# Patient Record
Sex: Male | Born: 1975 | Race: Black or African American | Hispanic: No | Marital: Single | Smoking: Never smoker
Health system: Southern US, Community
[De-identification: ages and names within clinical notes are randomized; demographics above are authoritative.]

## PROBLEM LIST (undated history)

## (undated) HISTORY — PX: LEG SURGERY: SHX1003

---

## 2014-09-18 ENCOUNTER — Ambulatory Visit: Payer: Self-pay

## 2014-09-18 ENCOUNTER — Encounter: Payer: Self-pay | Admitting: Podiatry

## 2014-09-18 NOTE — Progress Notes (Signed)
This encounter was created in error - please disregard.

## 2014-10-04 ENCOUNTER — Other Ambulatory Visit: Payer: Self-pay | Admitting: Obstetrics and Gynecology

## 2014-10-11 LAB — SEMEN ANALYSIS, BASIC
IMMATURE GERM CELL CONC.: 4.8 x10E6/mL
NORMAL MORPHOLOGY-STRICT: 25 % (ref >3)

## 2014-10-17 ENCOUNTER — Ambulatory Visit: Payer: Worker's Compensation

## 2014-10-17 ENCOUNTER — Ambulatory Visit
Admission: EM | Admit: 2014-10-17 | Discharge: 2014-10-17 | Disposition: A | Discharge status: Home / Self Care | Payer: Worker's Compensation | Attending: Family Medicine | Admitting: Family Medicine

## 2014-10-17 ENCOUNTER — Encounter: Payer: Self-pay | Admitting: Emergency Medicine

## 2014-10-17 DIAGNOSIS — S61210A Laceration without foreign body of right index finger without damage to nail, initial encounter: Secondary | ICD-10-CM | POA: Diagnosis not present

## 2014-10-17 MED ORDER — CEPHALEXIN 500 MG PO CAPS: 500.0000 mg | ORAL_CAPSULE | Freq: Four times a day (QID) | ORAL | Status: DC

## 2014-10-17 MED ORDER — TETANUS-DIPHTH-ACELL PERTUSSIS 5-2.5-18.5 LF-MCG/0.5 IM SUSP
0.5000 mL | Freq: Once | INTRAMUSCULAR | Status: AC
  Administered 2014-10-17: 0.5 mL via INTRAMUSCULAR

## 2014-10-17 MED ORDER — BACITRACIN ZINC 500 UNIT/GM EX OINT: TOPICAL_OINTMENT | Freq: Once | CUTANEOUS | Status: DC

## 2014-10-17 MED ORDER — CEPHALEXIN 500 MG PO CAPS: 500.0000 mg | ORAL_CAPSULE | Freq: Four times a day (QID) | ORAL | Status: AC

## 2014-10-17 MED ORDER — LIDOCAINE HCL (PF) 1 % IJ SOLN: 5.0000 mL | Freq: Once | INTRAMUSCULAR | Status: DC

## 2014-10-17 NOTE — ED Notes (Signed)
Patient states that he cut his right 2nd finger on a grinding wheel at work yesterday around 7:00pm.

## 2014-10-17 NOTE — ED Provider Notes (Signed)
[facility] Emergency Department Provider Note  ____________________________________________  Time seen: Approximately 9:19 AM  I have reviewed the triage vital signs and the nursing notes.   HISTORY  Chief Complaint Extremity Laceration and Worker's Comp Injury    HPI Oscar Bennett is a 39 y.o. male presents for complaint of laceration to right second finger. Patient reports laceration occurred at approximate 7 PM last night at work. 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 Denies crush injury. Reports pain at laceration site as well. States pain is 4 out of 10 aching pain. Denies pain radiation. Denies numbness or difficulty bending or moving finger. Denies other pain. Reports this is a workers comp injury. Reports has clean the wound multiple times with peroxide at home.    History reviewed. No pertinent past medical history.  There are no active problems to display for this patient.   Past Surgical History  Procedure Laterality Date  . Leg surgery Right     No current outpatient prescriptions on file. Allergies Review of patient's allergies indicates no known allergies.  History reviewed. No pertinent family history.  Social History Social History  Substance Use Topics  . Smoking status: Never Smoker   . Smokeless tobacco: None  . Alcohol Use: No    Review of Systems Constitutional: No fever/chills Eyes: No visual changes. ENT: No sore throat. Cardiovascular: Denies chest pain. Respiratory: Denies shortness of breath. Gastrointestinal: No abdominal pain.  No nausea, no vomiting.  No diarrhea.  No constipation. Genitourinary: Negative for dysuria. Musculoskeletal: Negative for back pain. Skin: Negative for rash. Positive for right second finger laceration.  Neurological: Negative for headaches, focal weakness or  numbness.  10-point ROS otherwise negative.  ____________________________________________   PHYSICAL EXAM:  VITAL SIGNS: ED Triage Vitals  Enc Vitals Group     BP 10/17/14 0856 135/85 mmHg     Pulse Rate 10/17/14 0856 72     Resp 10/17/14 0856 16     Temp 10/17/14 0856 97.2 F (36.2 C)     Temp Source 10/17/14 0856 Tympanic     SpO2 10/17/14 0856 97 %     Weight 10/17/14 0856 240 lb (108.863 kg)     Height 10/17/14 0856 6' (1.829 m)     Head Cir --      Peak Flow --      Pain Score 10/17/14 0858 5     Pain Loc --      Pain Edu? --      Excl. in GC? --     Constitutional: Alert and oriented. Well appearing and in no acute distress. Eyes: Conjunctivae are normal. PERRL. EOMI. Head: Atraumatic.  Mouth/Throat: Mucous membranes are moist.   Neck: No stridor.  No cervical spine tenderness to palpation. Cardiovascular: Normal rate, regular rhythm. Grossly normal heart sounds.  Good peripheral circulation. Respiratory: Normal respiratory effort.  No retractions. Lungs CTAB. Gastrointestinal: Soft and nontender.  Musculoskeletal: No lower or upper extremity tenderness nor edema.  No joint effusions. Bilateral pedal pulses equal and easily palpated.  Skin: Skin intact except: right medial distal second finger 1.5 cm gaping laceration, no erythema, no drainage, Full ROM, no tendon or motor deficit, mild TTP, No sensation deficit. Full ROm. Bilateral hand grips equal. Cap refill <2 secs.  Psychiatric: Mood and affect are normal. Speech and behavior are normal.  ____________________________________________   LABS (all labs ordered are  listed, but only abnormal results are displayed)  Labs Reviewed - No data to display  EXAM: RIGHT INDEX FINGER 2+V  COMPARISON: None.  FINDINGS: There is no evidence of fracture or dislocation. There is no evidence of arthropathy or other focal bone abnormality. There is soft tissue ulceration of the medial tip of the second  phalanx.  IMPRESSION: No acute osseous abnormality identified.   Electronically Signed By: Ted Mcalpine M.D. On: 10/17/2014 09:41 I, Renford Dills, personally viewed and evaluated these images (plain radiographs) as part of my medical decision making.    PROCEDURES  Procedure(s) performed:  Procedure explained and verbal consent obtained. Laceration Repaired  Location: right distal index finger Length: 1.5 cm Cleaned with saline and betadine with copious irrigation. Sterile technique Anesthesia with 1% Lidocaine 3 mls Repaired with # 2 5-0 nylon sutures Technique: simple interrupted Patient tolerate well. Wound well approximated post loose repair.  Antibiotic ointment and dressing applied.  Wound care instructions provided.  Observe for any signs of infection or other problems.    INITIAL IMPRESSION / ASSESSMENT AND PLAN / ED COURSE  Pertinent labs & imaging results that were available during my care of the patient were reviewed by me and considered in my medical decision making (see chart for details).  Presents for laceration repair post injury last night at work. 1.5 cm gaping and distal right index finger laceration present. X-ray negative for acute bony injury and negative for foreign body. Repaired with 2 sutures. Patient tolerated well. Will start patient on oral cephalexin prophylactic as the wound has been greater than 12 hours and due to mechanism. Discussed strict follow-up and return parameters including wound monitoring and cleaning. Return in 7 days for suture removal. Patient provides understanding and agree to plan.tdap updated.  ____________________________________________   FINAL CLINICAL IMPRESSION(S) / ED DIAGNOSES  Final diagnoses:  Laceration of right index finger w/o foreign body w/o damage to nail, initial encounter       Renford Dills, NP 10/17/14 1037  Renford Dills, NP 10/17/14 [phone]

## 2014-10-17 NOTE — Discharge Instructions (Signed)
Keep clean. Clean daily with soap and water, rinse, pat dry then apply thin layer of topical antibiotic ointment such as neosporin.   Return to Urgent care in 7 days for suture removal. Return sooner for redness, swelling, drainage, pain , new or worsening concerns.   Laceration Care, Adult A laceration is a cut or lesion that goes through all layers of the skin and into the tissue just beneath the skin. TREATMENT  Some lacerations may not require closure. Some lacerations may not be able to be closed due to an increased risk of infection. It is important to see your caregiver as soon as possible after an injury to minimize the risk of infection and maximize the opportunity for successful closure. If closure is appropriate, pain medicines may be given, if needed. The wound will be cleaned to help prevent infection. Your caregiver will use stitches (sutures), staples, wound glue (adhesive), or skin adhesive strips to repair the laceration. These tools bring the skin edges together to allow for faster healing and a better cosmetic outcome. However, all wounds will heal with a scar. Once the wound has healed, scarring can be minimized by covering the wound with sunscreen during the day for 1 full year. HOME CARE INSTRUCTIONS  For sutures or staples:  Keep the wound clean and dry.  If you were given a bandage (dressing), you should change it at least once a day. Also, change the dressing if it becomes wet or dirty, or as directed by your caregiver.  Wash the wound with soap and water 2 times a day. Rinse the wound off with water to remove all soap. Pat the wound dry with a clean towel.  After cleaning, apply a thin layer of the antibiotic ointment as recommended by your caregiver. This will help prevent infection and keep the dressing from sticking.  You may shower as usual after the first 24 hours. Do not soak the wound in water until the sutures are removed.  Only take over-the-counter or  prescription medicines for pain, discomfort, or fever as directed by your caregiver.  Get your sutures or staples removed as directed by your caregiver. For skin adhesive strips:  Keep the wound clean and dry.  Do not get the skin adhesive strips wet. You may bathe carefully, using caution to keep the wound dry.  If the wound gets wet, pat it dry with a clean towel.  Skin adhesive strips will fall off on their own. You may trim the strips as the wound heals. Do not remove skin adhesive strips that are still stuck to the wound. They will fall off in time. For wound adhesive:  You may briefly wet your wound in the shower or bath. Do not soak or scrub the wound. Do not swim. Avoid periods of heavy perspiration until the skin adhesive has fallen off on its own. After showering or bathing, gently pat the wound dry with a clean towel.  Do not apply liquid medicine, cream medicine, or ointment medicine to your wound while the skin adhesive is in place. This may loosen the film before your wound is healed.  If a dressing is placed over the wound, be careful not to apply tape directly over the skin adhesive. This may cause the adhesive to be pulled off before the wound is healed.  Avoid prolonged exposure to sunlight or tanning lamps while the skin adhesive is in place. Exposure to ultraviolet light in the first year will darken the scar.  The skin adhesive  will usually remain in place for 5 to 10 days, then naturally fall off the skin. Do not pick at the adhesive film. You may need a tetanus shot if:  You cannot remember when you had your last tetanus shot.  You have never had a tetanus shot. If you get a tetanus shot, your arm may swell, get red, and feel warm to the touch. This is common and not a problem. If you need a tetanus shot and you choose not to have one, there is a rare chance of getting tetanus. Sickness from tetanus can be serious. SEEK MEDICAL CARE IF:   You have redness,  swelling, or increasing pain in the wound.  You see a red line that goes away from the wound.  You have yellowish-white fluid (pus) coming from the wound.  You have a fever.  You notice a bad smell coming from the wound or dressing.  Your wound breaks open before or after sutures have been removed.  You notice something coming out of the wound such as wood or glass.  Your wound is on your hand or foot and you cannot move a finger or toe. SEEK IMMEDIATE MEDICAL CARE IF:   Your pain is not controlled with prescribed medicine.  You have severe swelling around the wound causing pain and numbness or a change in color in your arm, hand, leg, or foot.  Your wound splits open and starts bleeding.  You have worsening numbness, weakness, or loss of function of any joint around or beyond the wound.  You develop painful lumps near the wound or on the skin anywhere on your body. MAKE SURE YOU:   Understand these instructions.  Will watch your condition.  Will get help right away if you are not doing well or get worse. Document Released: 01/25/2005 Document Revised: 04/19/2011 Document Reviewed: 07/21/2010 [facility] Patient Information 2015 [employer] [place]. This information is not intended to replace advice given to you by your health care provider. Make sure you discuss any questions you have with your health care provider.

## 2015-09-11 ENCOUNTER — Ambulatory Visit (INDEPENDENT_AMBULATORY_CARE_PROVIDER_SITE_OTHER): Payer: BLUE CROSS/BLUE SHIELD

## 2015-09-11 ENCOUNTER — Ambulatory Visit (INDEPENDENT_AMBULATORY_CARE_PROVIDER_SITE_OTHER): Payer: BLUE CROSS/BLUE SHIELD | Admitting: Podiatry

## 2015-09-11 ENCOUNTER — Encounter: Payer: Self-pay | Admitting: Podiatry

## 2015-09-11 VITALS — BP 144/91 | HR 78 | Resp 18

## 2015-09-11 DIAGNOSIS — M722 Plantar fascial fibromatosis: Secondary | ICD-10-CM | POA: Diagnosis not present

## 2015-09-11 DIAGNOSIS — M779 Enthesopathy, unspecified: Secondary | ICD-10-CM

## 2015-09-11 DIAGNOSIS — R52 Pain, unspecified: Secondary | ICD-10-CM

## 2015-09-11 DIAGNOSIS — M76829 Posterior tibial tendinitis, unspecified leg: Secondary | ICD-10-CM

## 2015-09-11 DIAGNOSIS — M6789 Other specified disorders of synovium and tendon, multiple sites: Secondary | ICD-10-CM | POA: Diagnosis not present

## 2015-09-11 MED ORDER — MELOXICAM 15 MG PO TABS: 15.0000 mg | ORAL_TABLET | Freq: Every day | ORAL | 2 refills | Status: DC

## 2015-09-11 NOTE — Progress Notes (Addendum)
Subjective:    Patient ID: Oscar Bennett, male    DOB: Jun 15, 1975, 40 y.o.   MRN: [id number]  HPI  40 year old male presents the office today for concerns of left foot which is been ongoing for about 8 months. He previously did go seen other physicians for this and he had 1 injection which should help. He also had an over-the-counter inserts which have helped some degree. He still needs to get pain. 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 He has pain in the morning he first gets up after standing all day. States he is flatfoot. Denies any recent injury or trauma. No swelling or redness. No numbness or tingling. No other complaints at this time.  Review of Systems  All other systems reviewed and are negative.      Objective:   Physical Exam General: AAO x3, NAD  Dermatological: Skin is warm, dry and supple bilateral. Nails x 10 are well manicured; remaining integument appears unremarkable at this time. There are no open sores, no preulcerative lesions, no rash or signs of infection present.  Vascular: Dorsalis Pedis artery and Posterior Tibial artery pedal pulses are 2/4 bilateral with immedate capillary fill time. Pedal hair growth present. No varicosities and no lower extremity edema present bilateral. There is no pain with calf compression, swelling, warmth, erythema.   Neruologic: Grossly intact via light touch bilateral. Vibratory intact via tuning fork bilateral. Protective threshold with Semmes Wienstein monofilament intact to all pedal sites bilateral. Patellar and Achilles deep tendon reflexes 2+ bilateral.   Musculoskeletal: Tenderness to palpation along the plantar medial tubercle of the calcaneus at the insertion of plantar fascia on the left foot. There is no pain along the course of the plantar fascia within the arch of the foot. Plantar fascia appears to be intact. There is no pain with lateral compression of the calcaneus or pain with vibratory sensation.  There is no pain along the course or insertion of the achilles tendon. There is also mild discomfort on the insertion of the posterior tibial tendon into the navicular. He is able to do a single and double heel rise. No other areas of tenderness to bilateral lower extremities. There is a significant decrease in medial arch height upon weightbearing. Equinus is present. MMT 5/5.  Marland Kitchen  Gait: Unassisted, Nonantalgic.      Assessment & Plan:  40 year old male left flatfoot deformity with tendinitis, plantar fasciitis -Treatment options discussed including all alternatives, risks, and complications -Etiology of symptoms were discussed -X-rays were obtained and reviewed with the patient. Evidence of flatfoot deformity. No evidence of acute fracture. -Patient elects to proceed with steroid injection into the left heel. Under sterile skin preparation, a total of 2.5cc of kenalog 10, 0.5% Marcaine plain, and 2% lidocaine plain were infiltrated into the symptomatic area without complication. A band-aid was applied. Patient tolerated the injection well without complication. Post-injection care with discussed with the patient. Discussed with the patient to ice the area over the next couple of days to help prevent a steroid flare.  -Ankle brace dispensed -Discussed shoe gear modifications and orthotics. He will but can proceed with custom orthotics he was scanned for them and they were sent to [facility] labs. -Prescribed mobic. Discussed side effects of the medication and directed to stop if any are to occur and call the office.  -Stretching exercises daily. -Follow-up as scheduled or sooner if any problems arise. In the meantime, encouraged to call the office with any questions, concerns, change  in symptoms.   Celesta Gentile, DPM

## 2015-09-11 NOTE — Patient Instructions (Signed)

## 2015-09-14 DIAGNOSIS — M722 Plantar fascial fibromatosis: Secondary | ICD-10-CM | POA: Insufficient documentation

## 2015-09-14 HISTORY — DX: Plantar fascial fibromatosis: M72.2

## 2015-10-02 ENCOUNTER — Encounter: Payer: Self-pay | Admitting: Podiatry

## 2015-10-02 ENCOUNTER — Ambulatory Visit (INDEPENDENT_AMBULATORY_CARE_PROVIDER_SITE_OTHER): Payer: BLUE CROSS/BLUE SHIELD | Admitting: Podiatry

## 2015-10-02 DIAGNOSIS — M722 Plantar fascial fibromatosis: Secondary | ICD-10-CM

## 2015-10-02 MED ORDER — METHYLPREDNISOLONE 4 MG PO TBPK: ORAL_TABLET | ORAL | 0 refills | Status: DC

## 2015-10-02 NOTE — Patient Instructions (Signed)

## 2015-10-02 NOTE — Progress Notes (Signed)
Subjective: Oscar Bennett presents to the office today for follow-up evaluation of left heel pain. States he was doing better but the the pain came back and he was walking on his toes last night due to pain. 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 He states that he stopped stretching and icing once the pain resolved and the pain came back. Also presents a pickup orthotics. No other complaints at this time. No acute changes since last appointment. They deny any systemic complaints such as fevers, chills, nausea, vomiting.  Objective: General: AAO x3, NAD  Dermatological: Skin is warm, dry and supple bilateral. Nails x 10 are well manicured; remaining integument appears unremarkable at this time. There are no open sores, no preulcerative lesions, no rash or signs of infection present.  Vascular: Dorsalis Pedis artery and Posterior Tibial artery pedal pulses are 2/4 bilateral with immedate capillary fill time. Pedal hair growth present. There is no pain with calf compression, swelling, warmth, erythema.   Neruologic: Grossly intact via light touch bilateral. Vibratory intact via tuning fork bilateral. Protective threshold with Semmes Wienstein monofilament intact to all pedal sites bilateral.   Musculoskeletal: There is continued tenderness palpation along the plantar medial tubercle of the calcaneus at the insertion of the plantar fascia on the left foot. There is no pain along the course of the plantar fascia within the arch of the foot. Plantar fascia appears to be intact bilaterally. There is no pain with lateral compression of the calcaneus and there is no pain with vibratory sensation. There is no pain along the course or insertion of the Achilles tendon. There are no other areas of tenderness to bilateral lower extremities. No gross boney pedal deformities bilateral. There is a decrease in medial arch height upon weightbearing. Equinus is present. No pain, crepitus, or limitation noted with  foot and ankle range of motion bilateral. Muscular strength 5/5 in all groups tested bilateral.  Gait: Unassisted, Nonantalgic.   Assessment: Presents for follow-up evaluation for heel pain, likely plantar fasciitis   Plan: -Treatment options discussed including all alternatives, risks, and complications -Patient elects to proceed with steroid injection into the left heel. Under sterile skin preparation, a total of 2.5cc of kenalog 10, 0.5% Marcaine plain, and 2% lidocaine plain were infiltrated into the symptomatic area without complication. A band-aid was applied. Patient tolerated the injection well without complication. Post-injection care with discussed with the patient. Discussed with the patient to ice the area over the next couple of days to help prevent a steroid flare.  -Prescribed Medrol Dosepak. Once this is completely can restart the anti-inflammatory but do not take together, He understood this. -Orthotics were dispensed today. Oral and written reconstructions were discussed the patient. -Ice and stretching exercises on a daily basis. -Continue supportive shoe gear. -Follow-up in 4 weeks or sooner if any problems arise. In the meantime, encouraged to call the office with any questions, concerns, change in symptoms.   Ovid CurdMatthew Wagoner, DPM

## 2015-10-30 ENCOUNTER — Encounter: Payer: Self-pay | Admitting: Podiatry

## 2015-10-30 ENCOUNTER — Ambulatory Visit (INDEPENDENT_AMBULATORY_CARE_PROVIDER_SITE_OTHER): Payer: BLUE CROSS/BLUE SHIELD | Admitting: Podiatry

## 2015-10-30 DIAGNOSIS — M722 Plantar fascial fibromatosis: Secondary | ICD-10-CM

## 2015-10-30 DIAGNOSIS — M76829 Posterior tibial tendinitis, unspecified leg: Secondary | ICD-10-CM

## 2015-10-30 DIAGNOSIS — M6789 Other specified disorders of synovium and tendon, multiple sites: Secondary | ICD-10-CM

## 2015-10-30 NOTE — Progress Notes (Signed)
Subjective: Oscar Bennett presents to the office today for follow-up evaluation of left heel pain. He states that the second steroid injection didn't help more but the pain does recur. He's been stretching, icing as well as wearing the orthotics without significant prolonged improvement in symptoms. He denies any recent injury or trauma. No swelling or redness. No numbness or tingling. Pain does not wake him up at night. No other complaints at this time. No acute changes since last appointment. They deny any systemic complaints such as fevers, chills, nausea, vomiting.  Objective: General: AAO x3, NAD  Dermatological: Skin is warm, dry and supple bilateral. Nails x 10 are well manicured; remaining integument appears unremarkable at this time. There are no open sores, no preulcerative lesions, no rash or signs of infection present.  Vascular: Dorsalis Pedis artery and Posterior Tibial artery pedal pulses are 2/4 bilateral with immedate capillary fill time. Pedal hair growth present. There is no pain with calf compression, swelling, warmth, erythema.   Neruologic: Grossly intact via light touch bilateral. Vibratory intact via tuning fork bilateral. Protective threshold with Semmes Wienstein monofilament intact to all pedal sites bilateral.   Musculoskeletal: There is continued although somewhat improved tenderness palpation along the plantar medial tubercle of the calcaneus at the insertion of the plantar fascia on the left foot. There is no pain along the course of the plantar fascia within the arch of the foot. Plantar fascia appears to be intact bilaterally. There is no pain with lateral compression of the calcaneus and there is no pain with vibratory sensation. There is no pain along the course or insertion of the Achilles tendon. There are no other areas of tenderness to bilateral lower extremities. No gross boney pedal deformities bilateral. There is a decrease in medial arch height upon  weightbearing. Equinus is present. No pain, crepitus, or limitation noted with foot and ankle range of motion bilateral. Muscular strength 5/5 in all groups tested bilateral.  Gait: Unassisted, Nonantalgic.   Assessment: Presents for follow-up evaluation for heel pain, likely plantar fasciitis   Plan: -Treatment options discussed including all alternatives, risks, and complications -Patient elects to proceed with steroid injection into the left heel. Under sterile skin preparation, a total of 2.5cc of kenalog 10, 0.5% Marcaine plain, and 2% lidocaine plain were infiltrated into the symptomatic area without complication. A band-aid was applied. Patient tolerated the injection well without complication. Post-injection care with discussed with the patient. Discussed with the patient to ice the area over the next couple of days to help prevent a steroid flare.  -Continue stretching, icing exercises daily. -Prescription provided today for physical therapy. -Continue his orthotics back for modifications to increase the arch of the foot. They appear to be fitting well but I believe that we can get more support. -Plantar fascial taping applied. Post instructions discussed.  -Follow up in 4 weeks or sooner if needed. Call any questions or concerns.  Ovid CurdMatthew Wagoner, DPM

## 2015-11-27 ENCOUNTER — Ambulatory Visit: Payer: BLUE CROSS/BLUE SHIELD | Admitting: Podiatry

## 2015-12-11 ENCOUNTER — Ambulatory Visit: Payer: BLUE CROSS/BLUE SHIELD | Admitting: Podiatry

## 2016-11-03 ENCOUNTER — Encounter: Payer: Self-pay | Admitting: *Deleted

## 2016-11-03 ENCOUNTER — Ambulatory Visit
Admission: EM | Admit: 2016-11-03 | Discharge: 2016-11-03 | Disposition: A | Discharge status: Home / Self Care | Payer: Self-pay | Attending: Family Medicine | Admitting: Family Medicine

## 2016-11-03 DIAGNOSIS — K529 Noninfective gastroenteritis and colitis, unspecified: Secondary | ICD-10-CM

## 2016-11-03 MED ORDER — CIPROFLOXACIN HCL 500 MG PO TABS: 500.0000 mg | ORAL_TABLET | Freq: Two times a day (BID) | ORAL | 0 refills | Status: DC

## 2016-11-03 NOTE — ED Provider Notes (Signed)
[facility]    CSN: [id number] Arrival date & time: 11/03/16  1435     History   Chief Complaint Chief Complaint  Patient presents with  . Diarrhea    HPI Oscar Bennett is a 41 y.o. male.   The history is provided by the patient.  Diarrhea  Quality:  Semi-solid Severity:  Moderate Onset quality:  Sudden Duration:  3 weeks Timing:  Intermittent Progression:  Unchanged Relieved by:  Nothing Ineffective treatments:  Anti-motility medications Associated symptoms: no abdominal pain, no arthralgias, no chills, no recent cough, no diaphoresis, no fever, no headaches, no myalgias, no URI and no vomiting   Risk factors: sick contacts   Risk factors: no recent antibiotic use, no suspicious food intake and no travel to endemic areas     History reviewed. No pertinent past medical history.  Patient Active Problem List   Diagnosis Date Noted  . Plantar fasciitis 09/14/2015    Past Surgical History:  Procedure Laterality Date  . LEG SURGERY Right        Home Medications    Prior to Admission medications   Medication Sig Start Date End Date Taking? Authorizing Provider  ciprofloxacin (CIPRO) 500 MG tablet Take 1 tablet (500 mg total) by mouth every 12 (twelve) hours. 11/03/16   Payton Mccallum, MD  meloxicam (MOBIC) 15 MG tablet Take 1 tablet (15 mg total) by mouth daily. 09/11/15   Vivi Barrack, DPM    Family History History reviewed. No pertinent family history.  Social History Social History  Substance Use Topics  . Smoking status: Never Smoker  . Smokeless tobacco: Never Used  . Alcohol use No     Allergies   Patient has no known allergies.   Review of Systems Review of Systems  Constitutional: Negative for chills, diaphoresis and fever.  Gastrointestinal: Positive for diarrhea. Negative for abdominal pain and vomiting.  Musculoskeletal: Negative for arthralgias and myalgias.  Neurological: Negative for headaches.      Physical Exam Triage Vital Signs ED Triage Vitals  Enc Vitals Group     BP 11/03/16 1456 (!) 152/94     Pulse Rate 11/03/16 1456 76     Resp 11/03/16 1456 16     Temp 11/03/16 1456 98.3 F (36.8 C)     Temp Source 11/03/16 1456 Oral     SpO2 11/03/16 1456 98 %     Weight 11/03/16 1458 250 lb (113.4 kg)     Height 11/03/16 1458 6' (1.829 m)     Head Circumference --      Peak Flow --      Pain Score 11/03/16 1547 0     Pain Loc --      Pain Edu? --      Excl. in GC? --    No data found.   Updated Vital Signs BP (!) 152/94 (BP Location: Left Arm)   Pulse 76   Temp 98.3 F (36.8 C) (Oral)   Resp 16   Ht 6' (1.829 m)   Wt 250 lb (113.4 kg)   SpO2 98%   BMI 33.91 kg/m   Visual Acuity Right Eye Distance:   Left Eye Distance:   Bilateral Distance:    Right Eye Near:   Left Eye Near:    Bilateral Near:     Physical Exam  Constitutional: He is oriented to person, place, and time. He appears well-developed and well-nourished. No distress.  HENT:  Head: Normocephalic and atraumatic.  Cardiovascular: Normal  rate, regular rhythm, normal heart sounds and intact distal pulses.   No murmur heard. Pulmonary/Chest: Effort normal and breath sounds normal. No respiratory distress. He has no wheezes. He has no rales.  Abdominal: Soft. Bowel sounds are normal. He exhibits no distension and no mass. There is no tenderness. There is no rebound and no guarding.  Neurological: He is alert and oriented to person, place, and time.  Skin: No rash noted. He is not diaphoretic.  Nursing note and vitals reviewed.    UC Treatments / Results  Labs (all labs ordered are listed, but only abnormal results are displayed) Labs Reviewed - No data to display  EKG  EKG Interpretation None       Radiology No results found.  Procedures Procedures (including critical care time)  Medications Ordered in UC Medications - No data to display   Initial Impression / Assessment and  Plan / UC Course  I have reviewed the triage vital signs and the nursing notes.  Pertinent labs & imaging results that were available during my care of the patient were reviewed by me and considered in my medical decision making (see chart for details).       Final Clinical Impressions(s) / UC Diagnoses   Final diagnoses:  Gastroenteritis    New Prescriptions Discharge Medication List as of 11/03/2016  3:45 PM    START taking these medications   Details  ciprofloxacin (CIPRO) 500 MG tablet Take 1 tablet (500 mg total) by mouth every 12 (twelve) hours., Starting Wed 11/03/2016, Normal       1. possible diagnosis reviewed with patient 2. rx as per orders above; reviewed possible side effects, interactions, risks and benefits; empiric treatment 3. Recommend supportive treatment with clear liquids then advance slowly as tolerated 4. Follow-up prn if symptoms worsen or don't improve   Controlled Substance Prescriptions [place] Controlled Substance Registry consulted? Not Applicable   Payton Mccallum, MD 11/03/16 1945

## 2016-11-03 NOTE — ED Triage Notes (Signed)
Intermittent diarrhea x3 weeks. Denies other symptoms.

## 2017-08-14 ENCOUNTER — Emergency Department: Payer: BLUE CROSS/BLUE SHIELD

## 2017-08-14 ENCOUNTER — Other Ambulatory Visit: Payer: Self-pay

## 2017-08-14 ENCOUNTER — Emergency Department
Admission: EM | Admit: 2017-08-14 | Discharge: 2017-08-14 | Disposition: A | Discharge status: Home / Self Care | Payer: BLUE CROSS/BLUE SHIELD | Attending: Emergency Medicine | Admitting: Emergency Medicine

## 2017-08-14 DIAGNOSIS — R030 Elevated blood-pressure reading, without diagnosis of hypertension: Secondary | ICD-10-CM | POA: Insufficient documentation

## 2017-08-14 DIAGNOSIS — R200 Anesthesia of skin: Secondary | ICD-10-CM | POA: Insufficient documentation

## 2017-08-14 DIAGNOSIS — R0981 Nasal congestion: Secondary | ICD-10-CM | POA: Diagnosis present

## 2017-08-14 DIAGNOSIS — Z79899 Other long term (current) drug therapy: Secondary | ICD-10-CM | POA: Diagnosis not present

## 2017-08-14 DIAGNOSIS — R202 Paresthesia of skin: Secondary | ICD-10-CM | POA: Diagnosis not present

## 2017-08-14 LAB — CBC WITH DIFFERENTIAL/PLATELET
Basophils Absolute: 0 10*3/uL (ref 0–0.1)
Basophils Relative: 1 %
EOS ABS: 0.2 10*3/uL (ref 0–0.7)
EOS PCT: 3 %
HCT: 42.2 % (ref 40.0–52.0)
Hemoglobin: 14.5 g/dL (ref 13.0–18.0)
LYMPHS ABS: 1.3 10*3/uL (ref 1.0–3.6)
Lymphocytes Relative: 17 %
MCH: 30.2 pg (ref 26.0–34.0)
MCHC: 34.3 g/dL (ref 32.0–36.0)
MCV: 88.1 fL (ref 80.0–100.0)
MONO ABS: 0.8 10*3/uL (ref 0.2–1.0)
MONOS PCT: 11 %
Neutro Abs: 5 10*3/uL (ref 1.4–6.5)
Neutrophils Relative %: 68 %
PLATELETS: 238 10*3/uL (ref 150–440)
RBC: 4.79 MIL/uL (ref 4.40–5.90)
RDW: 14.4 % (ref 11.5–14.5)
WBC: 7.4 10*3/uL (ref 3.8–10.6)

## 2017-08-14 LAB — COMPREHENSIVE METABOLIC PANEL
ALT: 32 U/L (ref 0–44)
AST: 29 U/L (ref 15–41)
Albumin: 4.3 g/dL (ref 3.5–5.0)
Alkaline Phosphatase: 36 U/L — ABNORMAL LOW (ref 38–126)
Anion gap: 8 (ref 5–15)
BUN: 15 mg/dL (ref 6–20)
CHLORIDE: 107 mmol/L (ref 98–111)
CO2: 25 mmol/L (ref 22–32)
Calcium: 8.9 mg/dL (ref 8.9–10.3)
Creatinine, Ser: 1.29 mg/dL — ABNORMAL HIGH (ref 0.61–1.24)
GFR calc Af Amer: >60 mL/min (ref >60)
Glucose, Bld: 141 mg/dL — ABNORMAL HIGH (ref 70–99)
Potassium: 3.2 mmol/L — ABNORMAL LOW (ref 3.5–5.1)
Sodium: 140 mmol/L (ref 135–145)
Total Bilirubin: 0.4 mg/dL (ref 0.3–1.2)
Total Protein: 7.4 g/dL (ref 6.5–8.1)

## 2017-08-14 MED ORDER — SALINE SPRAY 0.65 % NA SOLN
1.0000 | NASAL | Status: DC | PRN
  Administered 2017-08-14: 1 via NASAL
  Filled 2017-08-14: qty 44

## 2017-08-14 MED ORDER — LORATADINE 10 MG PO TABS
10.0000 mg | ORAL_TABLET | Freq: Once | ORAL | Status: AC
  Administered 2017-08-14: 10 mg via ORAL
  Filled 2017-08-14: qty 1

## 2017-08-14 MED ORDER — OXYMETAZOLINE HCL 0.05 % NA SOLN
1.0000 | Freq: Once | NASAL | Status: AC
  Administered 2017-08-14: 1 via NASAL
  Filled 2017-08-14: qty 15

## 2017-08-14 MED ORDER — PSEUDOEPHEDRINE HCL 30 MG PO TABS: 60.0000 mg | ORAL_TABLET | Freq: Once | ORAL | Status: DC

## 2017-08-14 NOTE — Discharge Instructions (Signed)
Please use your nasal spray as needed for the next 3 days and begin taking over-the-counter Zyrtec every day for the next month.  Make an appointment to establish care with primary care physician within the next week for blood pressure recheck.  Return to the emergency department sooner for any concerns.  It was a pleasure to take care of you today, and thank you for coming to our emergency department.  If you have any questions or concerns before leaving please ask the nurse to grab me and I'm more than happy to go through your aftercare instructions again.  If you were prescribed any opioid pain medication today such as Norco, Vicodin, Percocet, morphine, hydrocodone, or oxycodone please make sure you do not drive when you are taking this medication as it can alter your ability to drive safely.  If you have any concerns once you are home that you are not improving or are in fact getting worse before you can make it to your follow-up appointment, please do not hesitate to call 911 and come back for further evaluation.  Merrily BrittleNeil Ariana Cavenaugh, MD  Results for orders placed or performed during the hospital encounter of 08/14/17  CBC with Differential  Result Value Ref Range   WBC 7.4 3.8 - 10.6 K/uL   RBC 4.79 4.40 - 5.90 MIL/uL   Hemoglobin 14.5 13.0 - 18.0 g/dL   HCT 16.142.2 09.640.0 - 04.552.0 %   MCV 88.1 80.0 - 100.0 fL   MCH 30.2 26.0 - 34.0 pg   MCHC 34.3 32.0 - 36.0 g/dL   RDW 40.914.4 81.111.5 - 91.414.5 %   Platelets 238 150 - 440 K/uL   Neutrophils Relative % 68 %   Neutro Abs 5.0 1.4 - 6.5 K/uL   Lymphocytes Relative 17 %   Lymphs Abs 1.3 1.0 - 3.6 K/uL   Monocytes Relative 11 %   Monocytes Absolute 0.8 0.2 - 1.0 K/uL   Eosinophils Relative 3 %   Eosinophils Absolute 0.2 0 - 0.7 K/uL   Basophils Relative 1 %   Basophils Absolute 0.0 0 - 0.1 K/uL  Comprehensive metabolic panel  Result Value Ref Range   Sodium 140 135 - 145 mmol/L   Potassium 3.2 (L) 3.5 - 5.1 mmol/L   Chloride 107 98 - 111 mmol/L   CO2 25 22 - 32 mmol/L   Glucose, Bld 141 (H) 70 - 99 mg/dL   BUN 15 6 - 20 mg/dL   Creatinine, Ser 7.821.29 (H) 0.61 - 1.24 mg/dL   Calcium 8.9 8.9 - 95.610.3 mg/dL   Total Protein 7.4 6.5 - 8.1 g/dL   Albumin 4.3 3.5 - 5.0 g/dL   AST 29 15 - 41 U/L   ALT 32 0 - 44 U/L   Alkaline Phosphatase 36 (L) 38 - 126 U/L   Total Bilirubin 0.4 0.3 - 1.2 mg/dL   GFR calc non Af Amer >60 >60 mL/min   GFR calc Af Amer >60 >60 mL/min   Anion gap 8 5 - 15   Ct Head Wo Contrast  Result Date: 08/14/2017 CLINICAL DATA:  Right arm numbness and tingling on waking up about 30 minutes ago. Nasal congestion. EXAM: CT HEAD WITHOUT CONTRAST TECHNIQUE: Contiguous axial images were obtained from the base of the skull through the vertex without intravenous contrast. COMPARISON:  None. FINDINGS: Brain: No evidence of acute infarction, hemorrhage, hydrocephalus, extra-axial collection or mass lesion/mass effect. Vascular: No hyperdense vessel or unexpected calcification. Skull: Normal. Negative for fracture or focal lesion. Sinuses/Orbits:  No acute finding. Other: None. IMPRESSION: No acute intracranial abnormalities. Electronically Signed   By: Burman Nieves M.D.   On: 08/14/2017 03:43

## 2017-08-14 NOTE — ED Provider Notes (Signed)
[facility] Emergency Department Provider Note  ____________________________________________   First MD Initiated Contact with Patient 08/14/17 [phone]     (approximate)  I have reviewed the triage vital signs and the nursing notes.   HISTORY  Chief Complaint Numbness   HPI Oscar Bennett Early is a 42 y.o. male who self presents to the emergency department with 1 day of sinus congestion and nasal congestion.  He feels generally "full" in the front of his face.  Some dry cough.  No fevers or chills.  No headache.  No neck pain.  He has no past medical history although he does not see doctors regularly.  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  After waking up and decided to come to the hospital he noticed that his right arm was numb and tingling.  No pain.  It seemed to come on gradually is now mild to moderate in severity.  Nothing seems to make it better or worse.    No past medical history on file.  Patient Active Problem List   Diagnosis Date Noted  . Plantar fasciitis 09/14/2015    Past Surgical History:  Procedure Laterality Date  . LEG SURGERY Right     Prior to Admission medications   Medication Sig Start Date End Date Taking? Authorizing Provider  ciprofloxacin (CIPRO) 500 MG tablet Take 1 tablet (500 mg total) by mouth every 12 (twelve) hours. 11/03/16   Payton Mccallum, MD  meloxicam (MOBIC) 15 MG tablet Take 1 tablet (15 mg total) by mouth daily. 09/11/15   Vivi Barrack, DPM    Allergies Patient has no known allergies.  No family history on file.  Social History Social History   Tobacco Use  . Smoking status: Never Smoker  . Smokeless tobacco: Never Used  Substance Use Topics  . Alcohol use: No  . Drug use: Not on file    Review of Systems Constitutional: No fever/chills Eyes: No visual changes. ENT: Positive for congestion Cardiovascular: Denies chest pain. Respiratory: Denies shortness of  breath. Gastrointestinal: No abdominal pain.  No nausea, no vomiting.  No diarrhea.  No constipation. Genitourinary: Negative for dysuria. Musculoskeletal: Negative for back pain. Skin: Negative for rash. Neurological: Positive for arm numbness   ____________________________________________   PHYSICAL EXAM:  VITAL SIGNS: ED Triage Vitals  Enc Vitals Group     BP 08/14/17 0327 (!) 155/117     Pulse Rate 08/14/17 0327 (!) 115     Resp 08/14/17 0327 16     Temp 08/14/17 0327 98.7 F (37.1 C)     Temp Source 08/14/17 0327 Oral     SpO2 08/14/17 0327 97 %     Weight 08/14/17 0322 250 lb (113.4 kg)     Height 08/14/17 0322 6' (1.829 m)     Head Circumference --      Peak Flow --      Pain Score 08/14/17 0321 0     Pain Loc --      Pain Edu? --      Excl. in GC? --     Constitutional: Alert and oriented x4 appropriate cooperative no distress Eyes: PERRL EOMI. Head: Atraumatic. Nose: Positive for nasal edema Mouth/Throat: No trismus Neck: No stridor.   Cardiovascular: Normal rate, regular rhythm. Grossly normal heart sounds.  Good peripheral circulation. Respiratory: Normal respiratory effort.  No retractions. Lungs CTAB and moving good air Gastrointestinal: Soft nontender Musculoskeletal: No lower extremity edema   Neurologic:  Normal speech  and language. No gross focal neurologic deficits are appreciated. Skin:  Skin is warm, dry and intact. No rash noted. Psychiatric: Mood and affect are normal. Speech and behavior are normal.    ____________________________________________   DIFFERENTIAL includes but not limited to  Sinusitis, sinus congestion, allergies, stroke, TIA, radicular symptoms, anxiety ____________________________________________   LABS (all labs ordered are listed, but only abnormal results are displayed)  Labs Reviewed  COMPREHENSIVE METABOLIC PANEL - Abnormal; Notable for the following components:      Result Value   Potassium 3.2 (*)     Glucose, Bld 141 (*)    Creatinine, Ser 1.29 (*)    Alkaline Phosphatase 36 (*)    All other components within normal limits  CBC WITH DIFFERENTIAL/PLATELET    Lab work reviewed by me shows slightly elevated creatinine although he is extremely muscular and this could be secondary to that __________________________________________  EKG    ____________________________________________  RADIOLOGY  Head CT reviewed by me with no acute disease ____________________________________________   PROCEDURES  Procedure(s) performed: no  Procedures  Critical Care performed: no  ____________________________________________   INITIAL IMPRESSION / ASSESSMENT AND PLAN / ED COURSE  Pertinent labs & imaging results that were available during my care of the patient were reviewed by me and considered in my medical decision making (see chart for details).   The patient arrives hypertensive with facial congestion consistent with sinus congestion versus viral sinusitis.  Head CT ordered from triage fortunately is negative.  Given Afrin and Claritin which have improved his symptoms and the numbness in his arm of resolved.  We discussed his elevated blood pressure and while I am not diagnosing him with hypertension today it is certainly concerning.  I will refer him to primary care for a one-week blood pressure recheck.  Strict return precautions have been given and the patient verbalizes understanding agreement the plan.      ____________________________________________   FINAL CLINICAL IMPRESSION(S) / ED DIAGNOSES  Final diagnoses:  Sinus congestion  Elevated blood pressure reading      NEW MEDICATIONS STARTED DURING THIS VISIT:  Discharge Medication List as of 08/14/2017  5:48 AM       Note:  This document was prepared using Dragon voice recognition software and may include unintentional dictation errors.     Merrily Brittleifenbark, Quintel Mccalla, MD 08/15/17 [phone]

## 2017-08-14 NOTE — ED Notes (Signed)
Pt given medication as ordered; mother now at bedside to take him home when discharged; pt says he's ready to leave; Dr Lamont Snowballifenbark informed of such

## 2017-08-14 NOTE — ED Notes (Signed)
At bedside with Dr Lamont Snowballifenbark; pt says about 3 hours ago he felt like his sinuses were closing up; he got very nervous; by the time her arrived his right arm was numb; while waiting in the ED he began having numbness to the right side of his head and right side of his neck; currently c/o tingling in right arm; pt awake and alert; talking in complete coherent sentences

## 2017-08-14 NOTE — ED Triage Notes (Signed)
Patient reports having right arm numbness/tingling sensation when waking up for approximately 30 minutes.  Patient also reports having nasal congestion for the past 1.5 hours.

## 2019-07-02 IMAGING — CT CT HEAD W/O CM
3 series · 16 of 47 positions shown, 19 images · non-contrast
Comparison: None.

CLINICAL DATA: Right arm numbness and tingling on waking up about
30 minutes ago. Nasal congestion.

EXAM:
CT HEAD WITHOUT CONTRAST
TECHNIQUE: Contiguous axial images were obtained from the base of the skull
through the vertex without intravenous contrast.

[Series 3: head wo · axial · 0.43mm/px · z∈[-99,+26]mm · 10 of 30 slices shown, 13 images]
[im 3/30  brain]
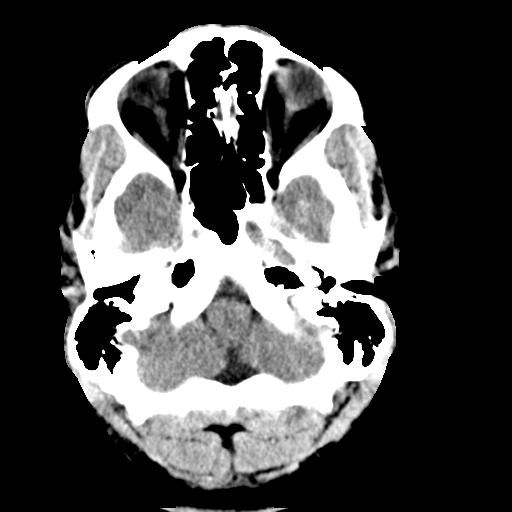
[im 3/30  bone]
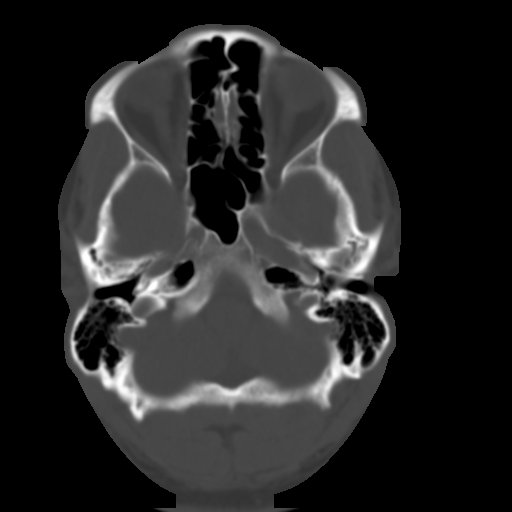
[im 6/30  brain]
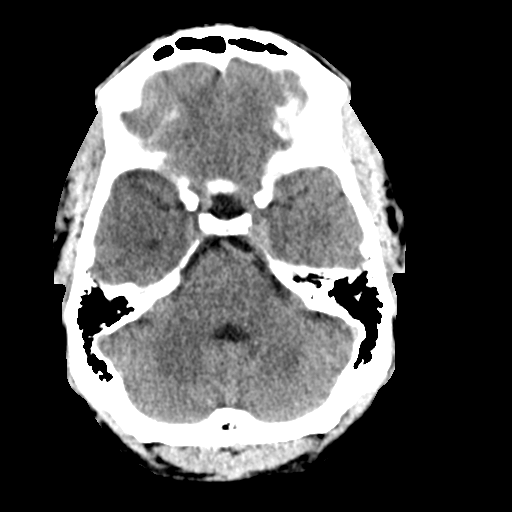
[im 9/30  brain]
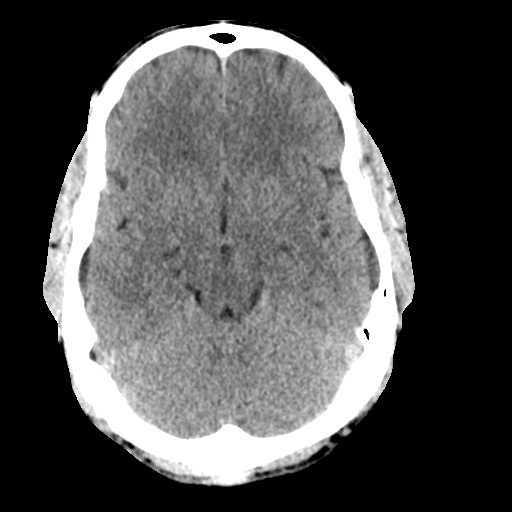
[im 11/30  brain]
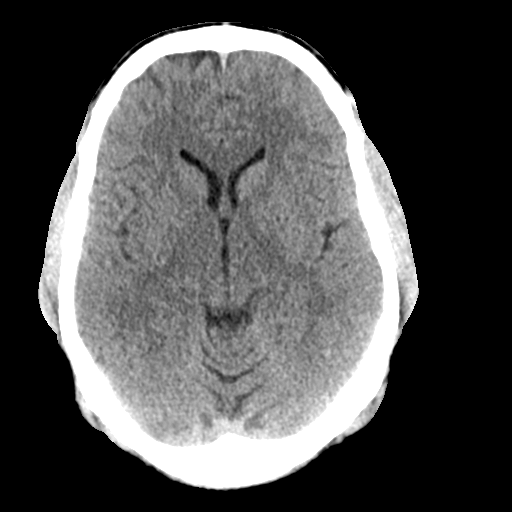
[im 14/30  brain]
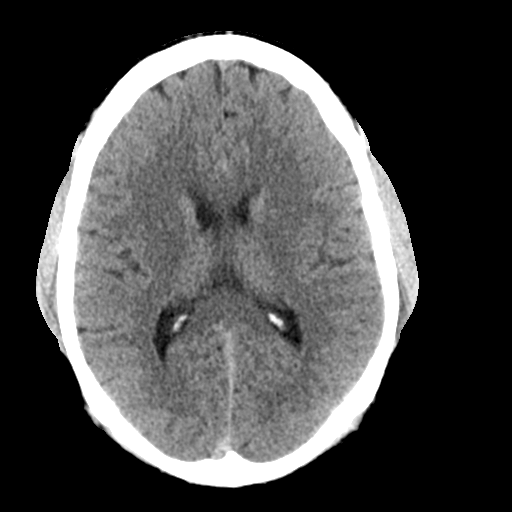
[im 14/30  bone]
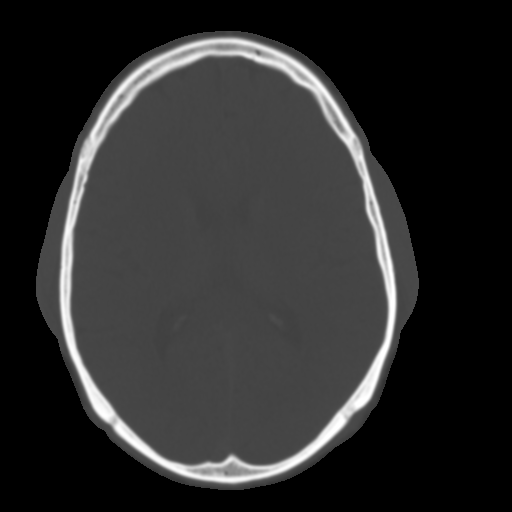
[im 17/30  brain]
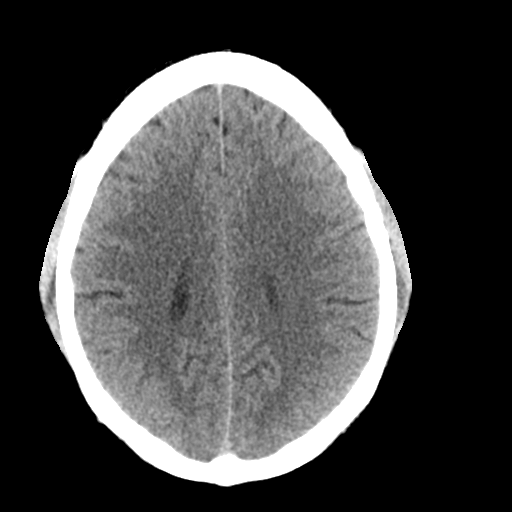
[im 20/30  brain]
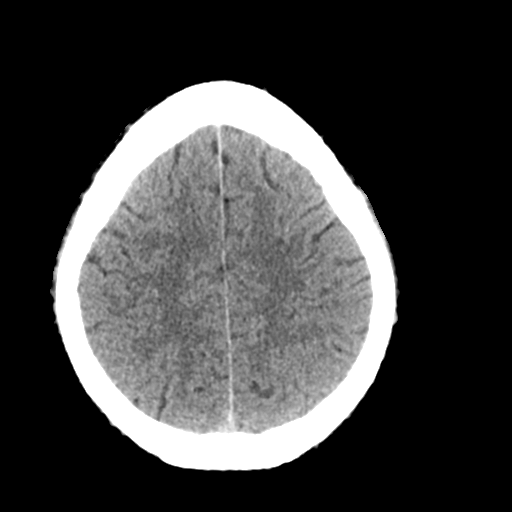
[im 23/30  brain]
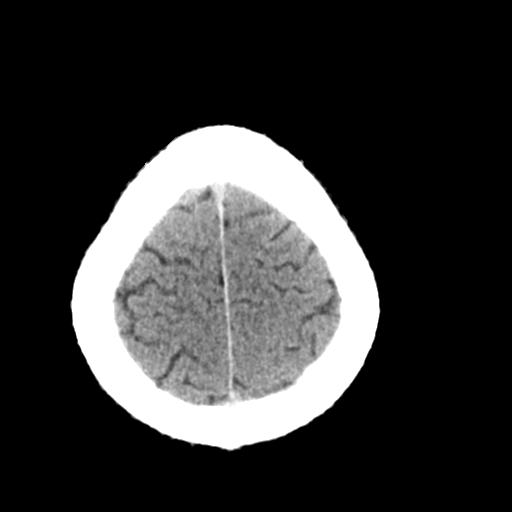
[im 25/30  brain]
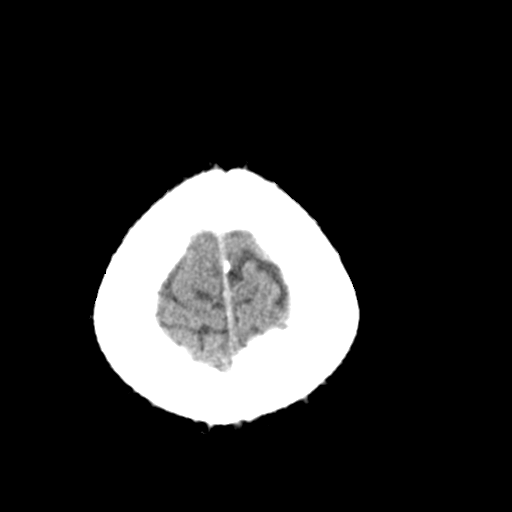
[im 25/30  bone]
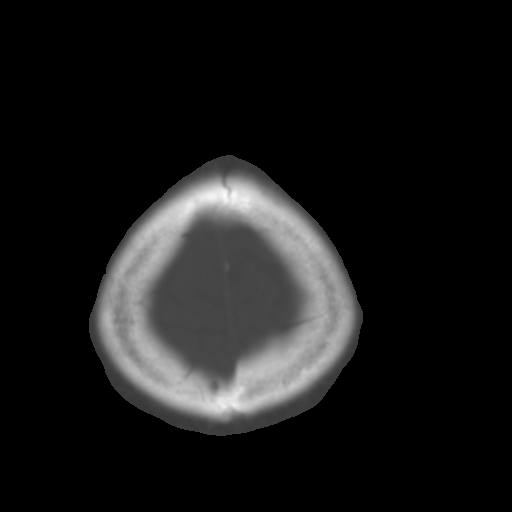
[im 28/30  brain]
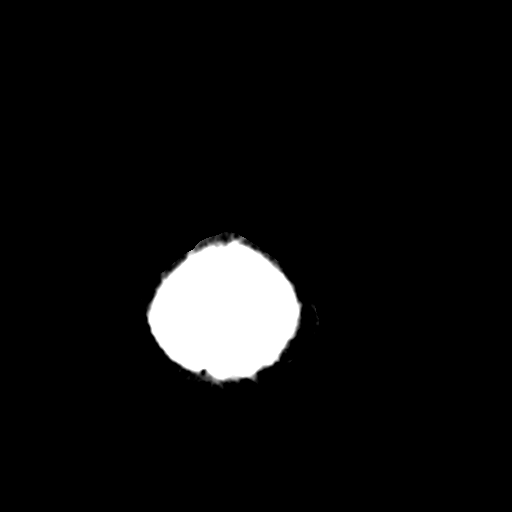

[Series 4: coronal soft tissue · coronal · 0.30mm/px · 3 of 68 slices shown]
[im 23/68  brain]
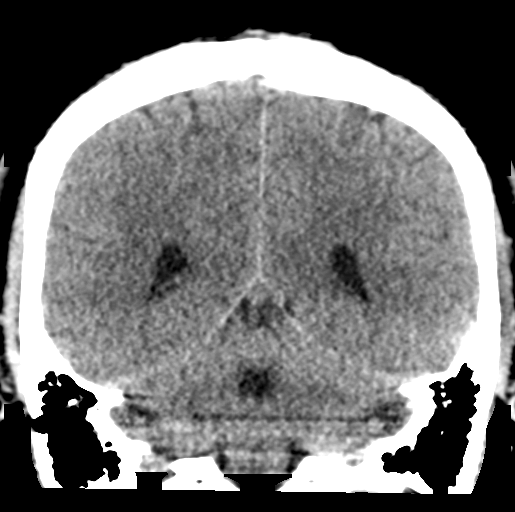
[im 30/68  brain]
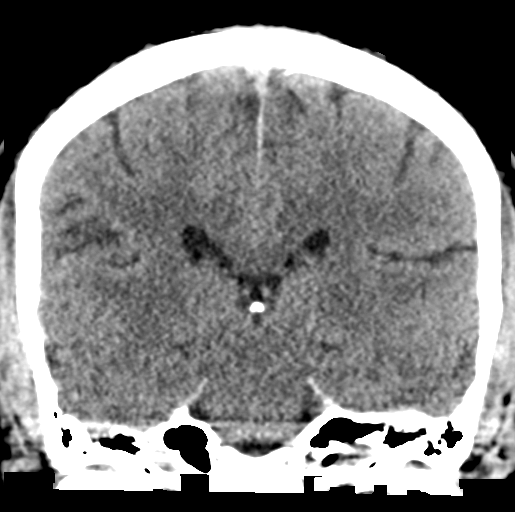
[im 38/68  brain]
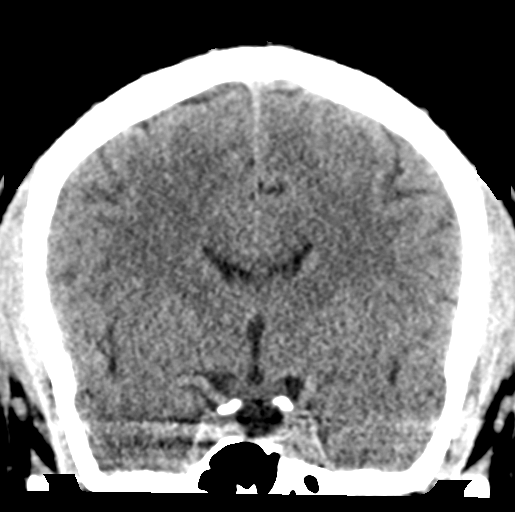

[Series 5: sagittal soft tissue · sagittal · 0.30mm/px · 3 of 52 slices shown]
[im 18/52  brain]
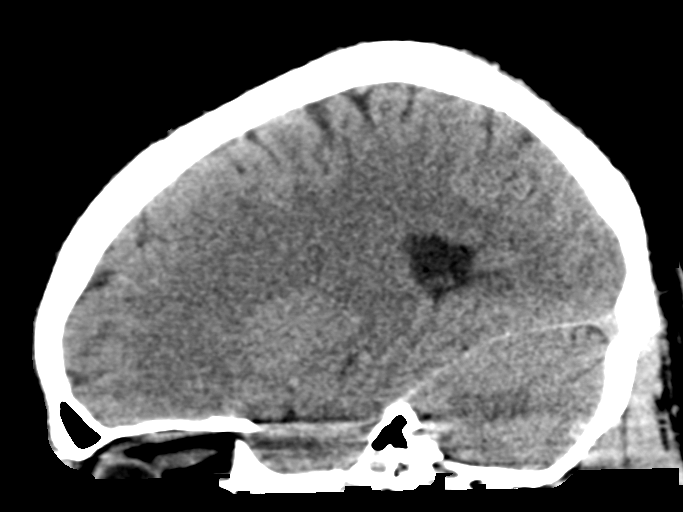
[im 26/52  brain]
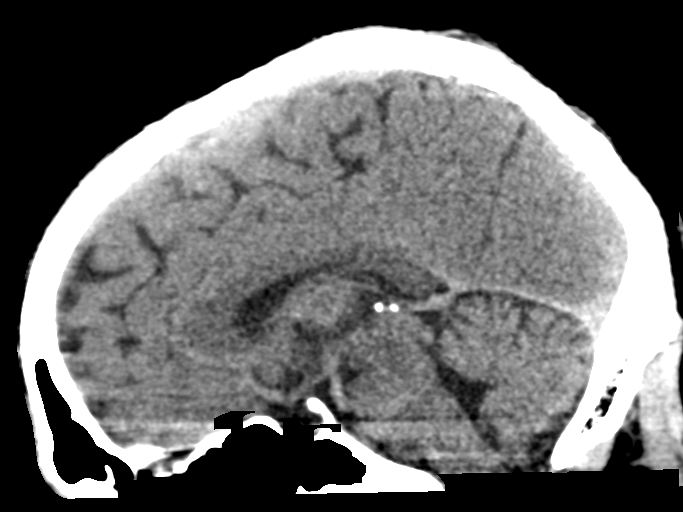
[im 35/52  brain]
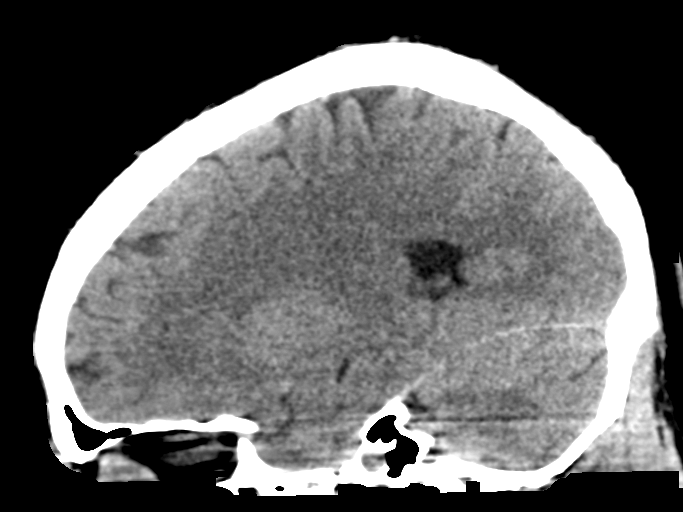

[16 of 47 positions shown; findings below may reference images not displayed]

FINDINGS: Brain: No evidence of acute infarction, hemorrhage, hydrocephalus,
extra-axial collection or mass lesion/mass effect.

Vascular: No hyperdense vessel or unexpected calcification.

Skull: Normal. Negative for fracture or focal lesion.

Sinuses/Orbits: No acute finding.

Other: None.
IMPRESSION: No acute intracranial abnormalities.

## 2019-08-02 ENCOUNTER — Ambulatory Visit: Payer: Self-pay | Attending: Internal Medicine

## 2022-09-02 ENCOUNTER — Ambulatory Visit: Payer: Medicaid Other | Admitting: Family

## 2022-09-02 VITALS — BP 140/115 | HR 79 | Ht 72.0 in | Wt 256.4 lb

## 2022-09-02 DIAGNOSIS — I10 Essential (primary) hypertension: Secondary | ICD-10-CM | POA: Diagnosis not present

## 2022-09-02 DIAGNOSIS — R7303 Prediabetes: Secondary | ICD-10-CM

## 2022-09-02 DIAGNOSIS — E538 Deficiency of other specified B group vitamins: Secondary | ICD-10-CM

## 2022-09-02 DIAGNOSIS — E782 Mixed hyperlipidemia: Secondary | ICD-10-CM | POA: Diagnosis not present

## 2022-09-02 DIAGNOSIS — E039 Hypothyroidism, unspecified: Secondary | ICD-10-CM

## 2022-09-02 DIAGNOSIS — R5383 Other fatigue: Secondary | ICD-10-CM

## 2022-09-02 DIAGNOSIS — E559 Vitamin D deficiency, unspecified: Secondary | ICD-10-CM

## 2022-09-02 NOTE — Progress Notes (Deleted)
Established Patient Office Visit  Subjective:  Patient ID: Oscar Bennett, male    DOB: 02/02/1976  Age: 47 y.o. MRN: [id number]  Chief Complaint  Patient presents with   Establish Care    NPE    HPI  No other concerns at this time.   No past medical history on file.  Past Surgical History:  Procedure Laterality Date   LEG SURGERY Right     Social History   Socioeconomic History   Marital status: Single    Spouse name: Not on file   Number of children: Not on file   Years of education: Not on file   Highest education level: Not on file  Occupational History   Not on file  Tobacco Use   Smoking status: Never   Smokeless tobacco: Never  Substance and Sexual Activity   Alcohol use: No   Drug use: Not on file   Sexual activity: Not on file  Other Topics Concern   Not on file  Social History Narrative   Not on file   Social Determinants of Health   Financial Resource Strain: Not on file  Food Insecurity: Not on file  Transportation Needs: Not on file  Physical Activity: Not on file  Stress: Not on file  Social Connections: Not on file  Intimate Partner Violence: Not on file    No family history on file.  No Known Allergies  ROS     Objective:   BP (!) 140/115   Pulse 79   Ht 6' (1.829 m)   Wt 256 lb 6.4 oz (116.3 kg)   SpO2 97%   BMI 34.77 kg/m   Vitals:   09/02/22 0935  BP: (!) 140/115  Pulse: 79  Height: 6' (1.829 m)  Weight: 256 lb 6.4 oz (116.3 kg)  SpO2: 97%  BMI (Calculated): 34.77    Physical Exam Vitals and nursing note reviewed.  Constitutional:      Appearance: Normal appearance. He is obese.  Eyes:     Extraocular Movements: Extraocular movements intact.     Conjunctiva/sclera: Conjunctivae normal.     Pupils: Pupils are equal, round, and reactive to light.  Cardiovascular:     Rate and Rhythm: Normal rate and regular rhythm.     Pulses: Normal pulses.     Heart sounds: Normal heart sounds.  Pulmonary:      Effort: Pulmonary effort is normal.     Breath sounds: Normal breath sounds.  Neurological:     General: No focal deficit present.     Mental Status: He is alert and oriented to person, place, and time. Mental status is at baseline.  Psychiatric:        Mood and Affect: Mood normal.        Behavior: Behavior normal.        Thought Content: Thought content normal.        Judgment: Judgment normal.      No results found for any visits on 09/02/22.  No results found for this or any previous visit (from the past 2160 hour(s)).     Assessment & Plan:   Problem List Items Addressed This Visit   None Visit Diagnoses     B12 deficiency due to diet    -  Primary   Relevant Orders   CBC With Differential   CMP14+EGFR   Vitamin B12   Hypothyroidism (acquired)       Relevant Orders   CBC With Differential   CMP14+EGFR  TSH   Essential hypertension, benign       Relevant Orders   CBC With Differential   CMP14+EGFR   Mixed hyperlipidemia       Relevant Orders   Lipid panel   CBC With Differential   CMP14+EGFR   Prediabetes       Relevant Orders   CBC With Differential   CMP14+EGFR   Hemoglobin A1c   Vitamin D deficiency, unspecified       Relevant Orders   VITAMIN D 25 Hydroxy (Vit-D Deficiency, Fractures)   CBC With Differential   CMP14+EGFR   Other fatigue       Relevant Orders   CBC With Differential   CMP14+EGFR       No follow-ups on file.   Total time spent: {AMA time spent:29001} minutes  Miki Kins, FNP  09/02/2022   This document may have been prepared by [facility] Voice Recognition software and as such may include unintentional dictation errors.

## 2022-09-03 LAB — VITAMIN D 25 HYDROXY (VIT D DEFICIENCY, FRACTURES): Vit D, 25-Hydroxy: 27.7 ng/mL — ABNORMAL LOW (ref 30.0–100.0)

## 2022-09-14 ENCOUNTER — Encounter: Payer: Self-pay | Admitting: Family

## 2022-09-16 ENCOUNTER — Encounter: Payer: Self-pay | Admitting: Family

## 2022-09-16 ENCOUNTER — Ambulatory Visit: Payer: Medicaid Other | Admitting: Family

## 2022-09-16 VITALS — BP 118/64 | HR 86 | Ht 72.0 in | Wt 258.0 lb

## 2022-09-16 DIAGNOSIS — R7303 Prediabetes: Secondary | ICD-10-CM | POA: Diagnosis not present

## 2022-09-16 DIAGNOSIS — E782 Mixed hyperlipidemia: Secondary | ICD-10-CM | POA: Diagnosis not present

## 2022-09-16 DIAGNOSIS — K219 Gastro-esophageal reflux disease without esophagitis: Secondary | ICD-10-CM | POA: Diagnosis not present

## 2022-09-16 DIAGNOSIS — E559 Vitamin D deficiency, unspecified: Secondary | ICD-10-CM | POA: Diagnosis not present

## 2022-09-16 MED ORDER — OMEPRAZOLE 20 MG PO CPDR: 20.0000 mg | DELAYED_RELEASE_CAPSULE | Freq: Every day | ORAL | 1 refills | Status: DC

## 2022-09-16 NOTE — Assessment & Plan Note (Signed)
Checking labs today.  Continue current therapy for lipid control. Will modify as needed based on labwork results.  

## 2022-09-16 NOTE — Progress Notes (Signed)
Established Patient Office Visit  Subjective:  Patient ID: Oscar Bennett, male    DOB: 02-25-1975  Age: 47 y.o. MRN: [id number]  Chief Complaint  Patient presents with   Follow-up    2 Weeks Follow Up    Pt. Here today for 2 week n/p follow up.   Had labs done at that time, so we will review in detail today.  Labs: LDL elevated, A1C is in prediabetic ranges, Vitamin D and B12 slightly low.   He says that he has started to spend more time working on himself and his faith, so he says his head is in a better place than it was several weeks ago when he was here.   He is still having heartburn, says he is using the pepcid frequently, but that it is not always helpful.  No other concerns today.     History reviewed. No pertinent past medical history.  Past Surgical History:  Procedure Laterality Date   LEG SURGERY Right     Social History   Socioeconomic History   Marital status: Single    Spouse name: Not on file   Number of children: Not on file   Years of education: Not on file   Highest education level: Not on file  Occupational History   Not on file  Tobacco Use   Smoking status: Never   Smokeless tobacco: Never  Substance and Sexual Activity   Alcohol use: No   Drug use: Yes   Sexual activity: Yes  Other Topics Concern   Not on file  Social History Narrative   Not on file   Social Determinants of Health   Financial Resource Strain: Not on file  Food Insecurity: Not on file  Transportation Needs: Not on file  Physical Activity: Not on file  Stress: Not on file  Social Connections: Not on file  Intimate Partner Violence: Not on file    History reviewed. No pertinent family history.  No Known Allergies  Review of Systems  Gastrointestinal:  Positive for heartburn and nausea.  All other systems reviewed and are negative.      Objective:   BP 118/64   Pulse 86   Ht 6' (1.829 m)   Wt 258 lb (117 kg)   SpO2 98%   BMI 34.99 kg/m    Vitals:   09/16/22 0922  BP: 118/64  Pulse: 86  Height: 6' (1.829 m)  Weight: 258 lb (117 kg)  SpO2: 98%  BMI (Calculated): 34.98    Physical Exam Vitals and nursing note reviewed.  Constitutional:      Appearance: Normal appearance. He is normal weight.  Eyes:     Extraocular Movements: Extraocular movements intact.     Conjunctiva/sclera: Conjunctivae normal.     Pupils: Pupils are equal, round, and reactive to light.  Cardiovascular:     Rate and Rhythm: Normal rate and regular rhythm.     Pulses: Normal pulses.     Heart sounds: Normal heart sounds.  Pulmonary:     Effort: Pulmonary effort is normal.     Breath sounds: Normal breath sounds.  Musculoskeletal:        General: Normal range of motion.  Neurological:     General: No focal deficit present.     Mental Status: He is alert and oriented to person, place, and time. Mental status is at baseline.  Psychiatric:        Mood and Affect: Mood normal.  Behavior: Behavior normal.        Thought Content: Thought content normal.        Judgment: Judgment normal.      No results found for any visits on 09/16/22.  Recent Results (from the past 2160 hour(s))  Lipid panel     Status: Abnormal   Collection Time: 09/02/22 10:36 AM  Result Value Ref Range   Cholesterol, Total 178 100 - 199 mg/dL   Triglycerides 66 0 - 149 mg/dL   HDL 42 >16 mg/dL   VLDL Cholesterol Cal 13 5 - 40 mg/dL   LDL Chol Calc (NIH) 109 (H) 0 - 99 mg/dL   Chol/HDL Ratio 4.2 0.0 - 5.0 ratio    Comment:                                   T. Chol/HDL Ratio                                             Men  Women                               1/2 Avg.Risk  3.4    3.3                                   Avg.Risk  5.0    4.4                                2X Avg.Risk  9.6    7.1                                3X Avg.Risk 23.4   11.0   VITAMIN D 25 Hydroxy (Vit-D Deficiency, Fractures)     Status: Abnormal   Collection Time: 09/02/22 10:36 AM   Result Value Ref Range   Vit D, 25-Hydroxy 27.7 (L) 30.0 - 100.0 ng/mL    Comment: Vitamin D deficiency has been defined by the Institute of Medicine and an Endocrine Society practice guideline as a level of serum 25-OH vitamin D less than 20 ng/mL (1,2). The Endocrine Society went on to further define vitamin D insufficiency as a level between 21 and 29 ng/mL (2). 1. IOM (Institute of Medicine). 2010. Dietary reference    intakes for calcium and D. [place]: The    [employer]. 2. Holick MF, Binkley Franklin, Bischoff-Ferrari HA, et al.    Evaluation, treatment, and prevention of vitamin D    deficiency: an Endocrine Society clinical practice    guideline. JCEM. 2011 Jul; 96(7):1911-30.   CBC With Differential     Status: None   Collection Time: 09/02/22 10:36 AM  Result Value Ref Range   WBC 3.6 3.4 - 10.8 x10E3/uL   RBC 5.18 4.14 - 5.80 x10E6/uL   Hemoglobin 15.5 13.0 - 17.7 g/dL   Hematocrit 60.4 54.0 - 51.0 %   MCV 92 79 - 97 fL   MCH 29.9 26.6 - 33.0 pg   MCHC 32.7 31.5 - 35.7 g/dL   RDW  14.1 11.6 - 15.4 %   Neutrophils 50 Not Estab. %   Lymphs 39 Not Estab. %   Monocytes 9 Not Estab. %   Eos 1 Not Estab. %   Basos 1 Not Estab. %   Neutrophils Absolute 1.8 1.4 - 7.0 x10E3/uL   Lymphocytes Absolute 1.4 0.7 - 3.1 x10E3/uL   Monocytes Absolute 0.3 0.1 - 0.9 x10E3/uL   EOS (ABSOLUTE) 0.1 0.0 - 0.4 x10E3/uL   Basophils Absolute 0.0 0.0 - 0.2 x10E3/uL   Immature Granulocytes 0 Not Estab. %   Immature Grans (Abs) 0.0 0.0 - 0.1 x10E3/uL    Comment: **Effective September 06, 2022, profile 284132 CBC/Differential**   (No Platelet) will be made non-orderable. [facility] Offers:   [id number] CBC With Differential/Platelet   CMP14+EGFR     Status: Abnormal   Collection Time: 09/02/22 10:36 AM  Result Value Ref Range   Glucose 90 70 - 99 mg/dL   BUN 17 6 - 24 mg/dL   Creatinine, Ser 4.40 0.76 - 1.27 mg/dL   eGFR 75 >10 UV/OZD/6.64   BUN/Creatinine Ratio 14 9 - 20   Sodium  146 (H) 134 - 144 mmol/L   Potassium 4.6 3.5 - 5.2 mmol/L   Chloride 105 96 - 106 mmol/L   CO2 25 20 - 29 mmol/L   Calcium 9.3 8.7 - 10.2 mg/dL   Total Protein 6.7 6.0 - 8.5 g/dL   Albumin 4.6 4.1 - 5.1 g/dL   Globulin, Total 2.1 1.5 - 4.5 g/dL   Bilirubin Total 0.4 0.0 - 1.2 mg/dL   Alkaline Phosphatase 39 (L) 44 - 121 IU/L   AST 17 0 - 40 IU/L   ALT 30 0 - 44 IU/L  TSH     Status: None   Collection Time: 09/02/22 10:36 AM  Result Value Ref Range   TSH 0.470 0.450 - 4.500 uIU/mL  Hemoglobin A1c     Status: Abnormal   Collection Time: 09/02/22 10:36 AM  Result Value Ref Range   Hgb A1c MFr Bld 6.0 (H) 4.8 - 5.6 %    Comment:          Prediabetes: 5.7 - 6.4          Diabetes: >6.4          Glycemic control for adults with diabetes: <7.0    Est. average glucose Bld gHb Est-mCnc 126 mg/dL  Vitamin Q03     Status: None   Collection Time: 09/02/22 10:36 AM  Result Value Ref Range   Vitamin B-12 403 232 - 1,245 pg/mL       Assessment & Plan:   Problem List Items Addressed This Visit       Active Problems   Gastroesophageal reflux disease without esophagitis - Primary    Start trial of Omeprazole. Pt. Will let me know if this is effective or not.   Reassess at follow up.        Relevant Medications   omeprazole (PRILOSEC) 20 MG capsule   Prediabetes    Patient educated on foods that contain carbohydrates and the need to decrease intake.  We discussed prediabetes, and what it means and the need for strict dietary control to prevent progression to type 2 diabetes.  Advised to decrease intake of sugary drinks, including sodas, sweet tea, and some juices, and of starch and sugar heavy foods (ie., potatoes, rice, bread, pasta, desserts). He verbalizes understanding and agreement with the changes discussed today.  A1C Continues to be in prediabetic ranges.  Will reassess at follow up after next lab check.  Patient counseled on dietary choices and verbalized understanding.         Mixed hyperlipidemia    Checking labs today.  Continue current therapy for lipid control. Will modify as needed based on labwork results.        Vitamin D deficiency, unspecified    Checking labs today.  Will continue supplements as needed.         Return in about 3 months (around 12/17/2022).   Total time spent: 30 minutes  Miki Kins, FNP  09/16/2022   This document may have been prepared by [facility] Voice Recognition software and as such may include unintentional dictation errors.

## 2022-09-16 NOTE — Assessment & Plan Note (Signed)
Start trial of Omeprazole. Pt. Will let me know if this is effective or not.   Reassess at follow up.

## 2022-09-16 NOTE — Assessment & Plan Note (Signed)
Patient educated on foods that contain carbohydrates and the need to decrease intake.  We discussed prediabetes, and what it means and the need for strict dietary control to prevent progression to type 2 diabetes.  Advised to decrease intake of sugary drinks, including sodas, sweet tea, and some juices, and of starch and sugar heavy foods (ie., potatoes, rice, bread, pasta, desserts). He verbalizes understanding and agreement with the changes discussed today.   A1C Continues to be in prediabetic ranges.  Will reassess at follow up after next lab check.  Patient counseled on dietary choices and verbalized understanding.   

## 2022-09-16 NOTE — Patient Instructions (Signed)
Vitamin D: Capsule form Vitamin B12: Dissolvable or liquid form.

## 2022-09-16 NOTE — Assessment & Plan Note (Signed)
Checking labs today.  Will continue supplements as needed.  

## 2022-09-18 ENCOUNTER — Encounter: Payer: Self-pay | Admitting: Family

## 2022-09-18 NOTE — Progress Notes (Signed)
New Patient Office Visit  Subjective    Patient ID: Oscar Bennett, male    DOB: 10-05-1975  Age: 47 y.o. MRN: [id number]  CC:  Chief Complaint  Patient presents with   Establish Care    NPE    HPI Oscar Bennett presents to establish care Previous Primary Care provider/office: n/a   he does have additional concerns to discuss today.   He needs a general check up, but he also has a few specific things that he is worried about: 1) His blood pressure has been consistently elevated lately.  It is quite elevated today, but he does say that he is VERY nervous to be here today.  2) He has been having headaches frequently.  3) He has a significant amount of anxiety/depression as well at the moment, says that he does not have any desire to participate in normal events, does not want to do much.  He is normally very active, goes to the gym, etc, so this is strange for him.   No other concerns today     Outpatient Encounter Medications as of 09/02/2022  Medication Sig   [DISCONTINUED] ciprofloxacin (CIPRO) 500 MG tablet Take 1 tablet (500 mg total) by mouth every 12 (twelve) hours. (Patient not taking: Reported on 09/02/2022)   [DISCONTINUED] meloxicam (MOBIC) 15 MG tablet Take 1 tablet (15 mg total) by mouth daily. (Patient not taking: Reported on 09/02/2022)   No facility-administered encounter medications on file as of 09/02/2022.    History reviewed. No pertinent past medical history.  Past Surgical History:  Procedure Laterality Date   LEG SURGERY Right    History reviewed. No pertinent family history.  Social History   Socioeconomic History   Marital status: Single    Spouse name: Not on file   Number of children: Not on file   Years of education: Not on file   Highest education level: Not on file  Occupational History   Not on file  Tobacco Use   Smoking status: Never   Smokeless tobacco: Never  Substance and Sexual Activity   Alcohol use: No    Drug use: Yes   Sexual activity: Yes  Other Topics Concern   Not on file  Social History Narrative   Not on file   Social Determinants of Health   Financial Resource Strain: Not on file  Food Insecurity: Not on file  Transportation Needs: Not on file  Physical Activity: Not on file  Stress: Not on file  Social Connections: Not on file  Intimate Partner Violence: Not on file    ROS      Objective    BP (!) 140/115   Pulse 79   Ht 6' (1.829 m)   Wt 256 lb 6.4 oz (116.3 kg)   SpO2 97%   BMI 34.77 kg/m   Physical Exam     Assessment & Plan:   Problem List Items Addressed This Visit       Active Problems   Prediabetes   Relevant Orders   CBC With Differential (Completed)   CMP14+EGFR (Completed)   Hemoglobin A1c (Completed)   Mixed hyperlipidemia   Relevant Orders   Lipid panel (Completed)   CBC With Differential (Completed)   CMP14+EGFR (Completed)   Vitamin D deficiency, unspecified   Relevant Orders   VITAMIN D 25 Hydroxy (Vit-D Deficiency, Fractures) (Completed)   CBC With Differential (Completed)   CMP14+EGFR (Completed)   Other Visit Diagnoses     B12 deficiency due  to diet    -  Primary   Relevant Orders   CBC With Differential (Completed)   CMP14+EGFR (Completed)   Vitamin B12 (Completed)   Hypothyroidism (acquired)       Relevant Orders   CBC With Differential (Completed)   CMP14+EGFR (Completed)   TSH (Completed)   Essential hypertension, benign       Relevant Orders   CBC With Differential (Completed)   CMP14+EGFR (Completed)   Other fatigue       Relevant Orders   CBC With Differential (Completed)   CMP14+EGFR (Completed)      Checking labs today I have suggested that pt. Call his insurance company to see if they are able to tell him who he can get set up with .  Return in about 2 weeks (around 09/16/2022) for F/U.   Total time spent: 30 minutes  Miki Kins, FNP  09/02/2022  This document may have been prepared by  [facility] Voice Recognition software and as such may include unintentional dictation errors.

## 2022-12-02 DIAGNOSIS — M25532 Pain in left wrist: Secondary | ICD-10-CM | POA: Insufficient documentation

## 2022-12-20 ENCOUNTER — Encounter: Payer: Self-pay | Admitting: Family

## 2022-12-20 ENCOUNTER — Ambulatory Visit: Payer: Medicaid Other | Admitting: Family

## 2022-12-20 VITALS — BP 140/100 | HR 82 | Ht 72.0 in | Wt 264.8 lb

## 2022-12-20 DIAGNOSIS — R5383 Other fatigue: Secondary | ICD-10-CM

## 2022-12-20 DIAGNOSIS — Z114 Encounter for screening for human immunodeficiency virus [HIV]: Secondary | ICD-10-CM

## 2022-12-20 DIAGNOSIS — E559 Vitamin D deficiency, unspecified: Secondary | ICD-10-CM | POA: Diagnosis not present

## 2022-12-20 DIAGNOSIS — I10 Essential (primary) hypertension: Secondary | ICD-10-CM | POA: Insufficient documentation

## 2022-12-20 DIAGNOSIS — E538 Deficiency of other specified B group vitamins: Secondary | ICD-10-CM | POA: Diagnosis not present

## 2022-12-20 DIAGNOSIS — Z1159 Encounter for screening for other viral diseases: Secondary | ICD-10-CM

## 2022-12-20 DIAGNOSIS — K219 Gastro-esophageal reflux disease without esophagitis: Secondary | ICD-10-CM

## 2022-12-20 DIAGNOSIS — R7303 Prediabetes: Secondary | ICD-10-CM | POA: Diagnosis not present

## 2022-12-20 DIAGNOSIS — E782 Mixed hyperlipidemia: Secondary | ICD-10-CM

## 2022-12-20 MED ORDER — AMLODIPINE BESYLATE 2.5 MG PO TABS: 2.5000 mg | ORAL_TABLET | Freq: Every day | ORAL | 0 refills | Status: DC

## 2022-12-20 MED ORDER — OMEPRAZOLE 20 MG PO CPDR: 20.0000 mg | DELAYED_RELEASE_CAPSULE | Freq: Every day | ORAL | 1 refills | Status: DC

## 2022-12-20 NOTE — Assessment & Plan Note (Signed)
Checking labs today.  Will continue supplements as needed.  

## 2022-12-20 NOTE — Assessment & Plan Note (Signed)
Blood pressure well controlled with current medications.  Continue current therapy.  Will reassess at follow up.  

## 2022-12-20 NOTE — Assessment & Plan Note (Signed)
Patient educated on foods that contain carbohydrates and the need to decrease intake.  We discussed prediabetes, and what it means and the need for strict dietary control to prevent progression to type 2 diabetes.  Advised to decrease intake of sugary drinks, including sodas, sweet tea, and some juices, and of starch and sugar heavy foods (ie., potatoes, rice, bread, pasta, desserts). He verbalizes understanding and agreement with the changes discussed today.   A1C Continues to be in prediabetic ranges.  Will reassess at follow up after next lab check.  Patient counseled on dietary choices and verbalized understanding.

## 2022-12-20 NOTE — Assessment & Plan Note (Signed)
Patient stable.  Well controlled with current therapy.   Continue current meds.  

## 2022-12-20 NOTE — Assessment & Plan Note (Signed)
Checking labs today.  Continue current therapy for lipid control. Will modify as needed based on labwork results.  

## 2022-12-20 NOTE — Progress Notes (Signed)
Established Patient Office Visit  Subjective:  Patient ID: Oscar Bennett, male    DOB: 1975/09/10  Age: 47 y.o. MRN: [id number]  Chief Complaint  Patient presents with   Follow-up    3 month follow up    Patient is here today for his 3 months follow up.  He has been feeling fairly well since last appointment.   He does have additional concerns to discuss today.  He has been feeling a bit down, as he hurt his wrist a few months ago, and has been unable to grip or use his left hand much since.  He is currently wearing a brace, is supposed to be getting set up for an MRI to further eval. He asks if it is normal for bad sprains or strains to take this long to heal.  Labs are due today. He needs refills.    I have reviewed his active problem list, medication list, allergies, health maintenance, notes from last encounter, lab results for his appointment today.      No other concerns at this time.   History reviewed. No pertinent past medical history.  Past Surgical History:  Procedure Laterality Date   LEG SURGERY Right     Social History   Socioeconomic History   Marital status: Single    Spouse name: Not on file   Number of children: Not on file   Years of education: Not on file   Highest education level: Not on file  Occupational History   Not on file  Tobacco Use   Smoking status: Never   Smokeless tobacco: Never  Substance and Sexual Activity   Alcohol use: No   Drug use: Yes   Sexual activity: Yes  Other Topics Concern   Not on file  Social History Narrative   Not on file   Social Determinants of Health   Financial Resource Strain: Not on file  Food Insecurity: Not on file  Transportation Needs: Not on file  Physical Activity: Not on file  Stress: Not on file  Social Connections: Not on file  Intimate Partner Violence: Not on file    History reviewed. No pertinent family history.  No Known Allergies  Review of Systems   Musculoskeletal:  Positive for joint pain.  All other systems reviewed and are negative.      Objective:   BP (!) 140/100   Pulse 82   Ht 6' (1.829 m)   Wt 264 lb 12.8 oz (120.1 kg)   SpO2 98%   BMI 35.91 kg/m   Vitals:   12/20/22 0904  BP: (!) 140/100  Pulse: 82  Height: 6' (1.829 m)  Weight: 264 lb 12.8 oz (120.1 kg)  SpO2: 98%  BMI (Calculated): 35.91    Physical Exam Vitals and nursing note reviewed.  Constitutional:      Appearance: Normal appearance. He is normal weight.  Eyes:     Pupils: Pupils are equal, round, and reactive to light.  Cardiovascular:     Rate and Rhythm: Normal rate and regular rhythm.     Pulses: Normal pulses.     Heart sounds: Normal heart sounds.  Pulmonary:     Effort: Pulmonary effort is normal.     Breath sounds: Normal breath sounds.  Musculoskeletal:     Left wrist: Swelling, tenderness and bony tenderness present. Decreased range of motion.  Neurological:     General: No focal deficit present.     Mental Status: He is alert. Mental status is  at baseline.  Psychiatric:        Mood and Affect: Mood normal.        Behavior: Behavior normal.        Thought Content: Thought content normal.        Judgment: Judgment normal.      No results found for any visits on 12/20/22.  No results found for this or any previous visit (from the past 2160 hour(s)).     Assessment & Plan:   Problem List Items Addressed This Visit       Active Problems   Gastroesophageal reflux disease without esophagitis    Patient stable.  Well controlled with current therapy.   Continue current meds.       Relevant Medications   omeprazole (PRILOSEC) 20 MG capsule   Prediabetes    Patient educated on foods that contain carbohydrates and the need to decrease intake.  We discussed prediabetes, and what it means and the need for strict dietary control to prevent progression to type 2 diabetes.  Advised to decrease intake of sugary drinks,  including sodas, sweet tea, and some juices, and of starch and sugar heavy foods (ie., potatoes, rice, bread, pasta, desserts). He verbalizes understanding and agreement with the changes discussed today.  A1C Continues to be in prediabetic ranges.  Will reassess at follow up after next lab check.  Patient counseled on dietary choices and verbalized understanding.        Relevant Orders   CMP14+EGFR   Hemoglobin A1c   CBC with Diff   Mixed hyperlipidemia    Checking labs today.  Continue current therapy for lipid control. Will modify as needed based on labwork results.       Relevant Medications   amLODipine (NORVASC) 2.5 MG tablet   Other Relevant Orders   Lipid panel   Vitamin D deficiency, unspecified    Checking labs today.  Will continue supplements as needed.       Relevant Orders   VITAMIN D 25 Hydroxy (Vit-D Deficiency, Fractures)   CMP14+EGFR   CBC with Diff   Essential hypertension, benign    Blood pressure well controlled with current medications.  Continue current therapy.  Will reassess at follow up.        Relevant Medications   amLODipine (NORVASC) 2.5 MG tablet   Other Relevant Orders   CMP14+EGFR   CBC with Diff   Other Visit Diagnoses     B12 deficiency due to diet    -  Primary   Checking labs today.  Will continue supplements as needed.   Relevant Orders   CMP14+EGFR   Vitamin B12   CBC with Diff   Other fatigue       Relevant Orders   CMP14+EGFR   TSH   CBC with Diff   Need for hepatitis C screening test       Test ordered in office today. Will call with results.   Relevant Orders   Hepatitis C Ab reflex to Quant PCR   CMP14+EGFR   CBC with Diff   Screening for HIV without presence of risk factors       Test ordered in office today. Will call with results.   Relevant Orders   HIV antibody (with reflex)   CMP14+EGFR   CBC with Diff       Return in about 3 months (around 03/22/2023) for F/U.   Total time spent: 20  minutes  Miki Kins, FNP  12/20/2022  This document may have been prepared by [employer] Voice Recognition software and as such may include unintentional dictation errors.

## 2022-12-21 LAB — CBC WITH DIFFERENTIAL/PLATELET
Basophils Absolute: 0 10*3/uL (ref 0.0–0.2)
Basos: 0 %
EOS (ABSOLUTE): 0.1 10*3/uL (ref 0.0–0.4)
Eos: 2 %
Hematocrit: 44.9 % (ref 37.5–51.0)
Hemoglobin: 14.6 g/dL (ref 13.0–17.7)
Immature Grans (Abs): 0 10*3/uL (ref 0.0–0.1)
Immature Granulocytes: 0 %
Lymphocytes Absolute: 1.5 10*3/uL (ref 0.7–3.1)
Lymphs: 44 %
MCH: 30.4 pg (ref 26.6–33.0)
MCHC: 32.5 g/dL (ref 31.5–35.7)
MCV: 93 fL (ref 79–97)
Monocytes Absolute: 0.4 10*3/uL (ref 0.1–0.9)
Monocytes: 11 %
Neutrophils Absolute: 1.5 10*3/uL (ref 1.4–7.0)
Neutrophils: 43 %
Platelets: 172 10*3/uL (ref 150–450)
RBC: 4.81 x10E6/uL (ref 4.14–5.80)
RDW: 13 % (ref 11.6–15.4)
WBC: 3.4 10*3/uL (ref 3.4–10.8)

## 2022-12-21 LAB — LIPID PANEL
Chol/HDL Ratio: 4.5 ratio (ref 0.0–5.0)
Cholesterol, Total: 168 mg/dL (ref 100–199)
HDL: 37 mg/dL — ABNORMAL LOW (ref >39)
LDL Chol Calc (NIH): 121 mg/dL — ABNORMAL HIGH (ref 0–99)
Triglycerides: 48 mg/dL (ref 0–149)
VLDL Cholesterol Cal: 10 mg/dL (ref 5–40)

## 2022-12-21 LAB — CMP14+EGFR
ALT: 23 [IU]/L (ref 0–44)
AST: 17 [IU]/L (ref 0–40)
Albumin: 4.1 g/dL (ref 4.1–5.1)
Alkaline Phosphatase: 37 [IU]/L — ABNORMAL LOW (ref 44–121)
BUN/Creatinine Ratio: 14 (ref 9–20)
BUN: 17 mg/dL (ref 6–24)
Bilirubin Total: 0.3 mg/dL (ref 0.0–1.2)
CO2: 22 mmol/L (ref 20–29)
Calcium: 8.7 mg/dL (ref 8.7–10.2)
Chloride: 106 mmol/L (ref 96–106)
Creatinine, Ser: 1.23 mg/dL (ref 0.76–1.27)
Globulin, Total: 1.9 g/dL (ref 1.5–4.5)
Glucose: 87 mg/dL (ref 70–99)
Potassium: 4.3 mmol/L (ref 3.5–5.2)
Sodium: 142 mmol/L (ref 134–144)
Total Protein: 6 g/dL (ref 6.0–8.5)
eGFR: 72 mL/min/{1.73_m2} (ref >59)

## 2022-12-21 LAB — HIV ANTIBODY (ROUTINE TESTING W REFLEX): HIV Screen 4th Generation wRfx: NONREACTIVE

## 2022-12-21 LAB — HEMOGLOBIN A1C
Est. average glucose Bld gHb Est-mCnc: 120 mg/dL
Hgb A1c MFr Bld: 5.8 % — ABNORMAL HIGH (ref 4.8–5.6)

## 2022-12-21 LAB — VITAMIN D 25 HYDROXY (VIT D DEFICIENCY, FRACTURES): Vit D, 25-Hydroxy: 21.7 ng/mL — ABNORMAL LOW (ref 30.0–100.0)

## 2022-12-21 LAB — TSH: TSH: 0.781 u[IU]/mL (ref 0.450–4.500)

## 2022-12-21 LAB — VITAMIN B12: Vitamin B-12: 385 pg/mL (ref 232–1245)

## 2022-12-21 LAB — HCV AB W REFLEX TO QUANT PCR: HCV Ab: NONREACTIVE

## 2022-12-21 LAB — HCV INTERPRETATION

## 2023-02-17 ENCOUNTER — Encounter: Payer: Self-pay | Admitting: Family

## 2023-02-17 DIAGNOSIS — K219 Gastro-esophageal reflux disease without esophagitis: Secondary | ICD-10-CM

## 2023-02-17 MED ORDER — OMEPRAZOLE 20 MG PO CPDR: 20.0000 mg | DELAYED_RELEASE_CAPSULE | Freq: Every day | ORAL | 1 refills | Status: DC

## 2023-03-23 ENCOUNTER — Ambulatory Visit: Payer: Medicaid Other | Admitting: Family

## 2023-03-23 ENCOUNTER — Encounter: Payer: Self-pay | Admitting: Family

## 2023-03-23 VITALS — BP 138/82 | HR 80 | Ht 72.0 in | Wt 260.0 lb

## 2023-03-23 DIAGNOSIS — E559 Vitamin D deficiency, unspecified: Secondary | ICD-10-CM

## 2023-03-23 DIAGNOSIS — R7303 Prediabetes: Secondary | ICD-10-CM | POA: Diagnosis not present

## 2023-03-23 DIAGNOSIS — Z23 Encounter for immunization: Secondary | ICD-10-CM | POA: Diagnosis not present

## 2023-03-23 DIAGNOSIS — K219 Gastro-esophageal reflux disease without esophagitis: Secondary | ICD-10-CM

## 2023-03-23 DIAGNOSIS — E782 Mixed hyperlipidemia: Secondary | ICD-10-CM | POA: Diagnosis not present

## 2023-03-23 DIAGNOSIS — I10 Essential (primary) hypertension: Secondary | ICD-10-CM

## 2023-03-23 MED ORDER — OMEPRAZOLE 20 MG PO CPDR: 20.0000 mg | DELAYED_RELEASE_CAPSULE | Freq: Every day | ORAL | 1 refills | Status: DC

## 2023-03-24 LAB — CBC WITH DIFFERENTIAL/PLATELET
Basophils Absolute: 0 10*3/uL (ref 0.0–0.2)
Basos: 0 %
EOS (ABSOLUTE): 0.1 10*3/uL (ref 0.0–0.4)
Eos: 2 %
Hematocrit: 46.4 % (ref 37.5–51.0)
Hemoglobin: 15.7 g/dL (ref 13.0–17.7)
Immature Grans (Abs): 0 10*3/uL (ref 0.0–0.1)
Immature Granulocytes: 0 %
Lymphocytes Absolute: 1.5 10*3/uL (ref 0.7–3.1)
Lymphs: 40 %
MCH: 32 pg (ref 26.6–33.0)
MCHC: 33.8 g/dL (ref 31.5–35.7)
MCV: 95 fL (ref 79–97)
Monocytes Absolute: 0.4 10*3/uL (ref 0.1–0.9)
Monocytes: 9 %
Neutrophils Absolute: 1.9 10*3/uL (ref 1.4–7.0)
Neutrophils: 49 %
Platelets: 198 10*3/uL (ref 150–450)
RBC: 4.91 x10E6/uL (ref 4.14–5.80)
RDW: 12.8 % (ref 11.6–15.4)
WBC: 3.9 10*3/uL (ref 3.4–10.8)

## 2023-03-24 LAB — CMP14+EGFR
ALT: 26 [IU]/L (ref 0–44)
AST: 22 [IU]/L (ref 0–40)
Albumin: 4.4 g/dL (ref 4.1–5.1)
Alkaline Phosphatase: 40 [IU]/L — ABNORMAL LOW (ref 44–121)
BUN/Creatinine Ratio: 11 (ref 9–20)
BUN: 14 mg/dL (ref 6–24)
Bilirubin Total: 0.4 mg/dL (ref 0.0–1.2)
CO2: 25 mmol/L (ref 20–29)
Calcium: 9.4 mg/dL (ref 8.7–10.2)
Chloride: 106 mmol/L (ref 96–106)
Creatinine, Ser: 1.29 mg/dL — ABNORMAL HIGH (ref 0.76–1.27)
Globulin, Total: 2.1 g/dL (ref 1.5–4.5)
Glucose: 93 mg/dL (ref 70–99)
Potassium: 4.3 mmol/L (ref 3.5–5.2)
Sodium: 143 mmol/L (ref 134–144)
Total Protein: 6.5 g/dL (ref 6.0–8.5)
eGFR: 68 mL/min/{1.73_m2} (ref >59)

## 2023-03-24 LAB — LIPID PANEL
Chol/HDL Ratio: 4.6 {ratio} (ref 0.0–5.0)
Cholesterol, Total: 181 mg/dL (ref 100–199)
HDL: 39 mg/dL — ABNORMAL LOW (ref >39)
LDL Chol Calc (NIH): 130 mg/dL — ABNORMAL HIGH (ref 0–99)
Triglycerides: 63 mg/dL (ref 0–149)
VLDL Cholesterol Cal: 12 mg/dL (ref 5–40)

## 2023-03-24 LAB — HEMOGLOBIN A1C
Est. average glucose Bld gHb Est-mCnc: 117 mg/dL
Hgb A1c MFr Bld: 5.7 % — ABNORMAL HIGH (ref 4.8–5.6)

## 2023-03-24 LAB — VITAMIN D 25 HYDROXY (VIT D DEFICIENCY, FRACTURES): Vit D, 25-Hydroxy: 38.5 ng/mL (ref 30.0–100.0)

## 2023-03-24 LAB — VITAMIN B12: Vitamin B-12: 1422 pg/mL — ABNORMAL HIGH (ref 232–1245)

## 2023-03-24 NOTE — Assessment & Plan Note (Signed)
Patient stable.  Well controlled with current therapy.   Continue current meds.

## 2023-03-24 NOTE — Assessment & Plan Note (Signed)
Continue current meds.  Will adjust as needed based on results.  The patient is asked to make an attempt to improve diet and exercise patterns to aid in medical management of this problem. Addressed importance of increasing and maintaining water intake.

## 2023-03-24 NOTE — Assessment & Plan Note (Signed)
A1C Continues to be in prediabetic ranges.  Will reassess at follow up after next lab check.  Patient counseled on dietary choices and verbalized understanding.

## 2023-03-24 NOTE — Progress Notes (Signed)
Established Patient Office Visit  Subjective:  Patient ID: Oscar Bennett, male    DOB: 1975/06/06  Age: 48 y.o. MRN: [id number]  Chief Complaint  Patient presents with   Follow-up    3 month follow up     Patient is here today for his 3 months follow up.  He has been feeling well since last appointment.   He does have additional concerns to discuss today.  He needs flu vaccination today.  He also says that he is doing much better than he was at his last appointment, says he feels well.  Has been going to the gym again, activity level has increased.  Labs are due today. He needs refills.   I have reviewed his active problem list, medication list, allergies, notes from last encounter, lab results for his appointment today.      No other concerns at this time.   No past medical history on file.  Past Surgical History:  Procedure Laterality Date   LEG SURGERY Right     Social History   Socioeconomic History   Marital status: Single    Spouse name: Not on file   Number of children: Not on file   Years of education: Not on file   Highest education level: Not on file  Occupational History   Not on file  Tobacco Use   Smoking status: Never   Smokeless tobacco: Never  Substance and Sexual Activity   Alcohol use: No   Drug use: Yes   Sexual activity: Yes  Other Topics Concern   Not on file  Social History Narrative   Not on file   Social Drivers of Health   Financial Resource Strain: Not on file  Food Insecurity: Not on file  Transportation Needs: Not on file  Physical Activity: Not on file  Stress: Not on file  Social Connections: Not on file  Intimate Partner Violence: Not on file    No family history on file.  No Known Allergies  Review of Systems  All other systems reviewed and are negative.      Objective:   BP 138/82   Pulse 80   Ht 6' (1.829 m)   Wt 260 lb (117.9 kg)   SpO2 99%   BMI 35.26 kg/m   Vitals:   03/23/23 0905   BP: 138/82  Pulse: 80  Height: 6' (1.829 m)  Weight: 260 lb (117.9 kg)  SpO2: 99%  BMI (Calculated): 35.25    Physical Exam Vitals and nursing note reviewed.  Constitutional:      Appearance: Normal appearance. He is normal weight.  HENT:     Head: Normocephalic and atraumatic.     Nose: Nose normal.  Eyes:     Pupils: Pupils are equal, round, and reactive to light.  Cardiovascular:     Rate and Rhythm: Normal rate and regular rhythm.     Pulses: Normal pulses.     Heart sounds: Normal heart sounds.  Pulmonary:     Effort: Pulmonary effort is normal.     Breath sounds: Normal breath sounds.  Abdominal:     General: Bowel sounds are normal.  Musculoskeletal:        General: Normal range of motion.     Cervical back: Normal range of motion.  Neurological:     General: No focal deficit present.     Mental Status: He is alert and oriented to person, place, and time. Mental status is at baseline.  Psychiatric:  Mood and Affect: Mood normal.        Behavior: Behavior normal.      Results for orders placed or performed in visit on 03/23/23  Lipid panel  Result Value Ref Range   Cholesterol, Total 181 100 - 199 mg/dL   Triglycerides 63 0 - 149 mg/dL   HDL 39 (L) >78 mg/dL   VLDL Cholesterol Cal 12 5 - 40 mg/dL   LDL Chol Calc (NIH) 295 (H) 0 - 99 mg/dL   Chol/HDL Ratio 4.6 0.0 - 5.0 ratio  VITAMIN D 25 Hydroxy (Vit-D Deficiency, Fractures)  Result Value Ref Range   Vit D, 25-Hydroxy 38.5 30.0 - 100.0 ng/mL  CMP14+EGFR  Result Value Ref Range   Glucose 93 70 - 99 mg/dL   BUN 14 6 - 24 mg/dL   Creatinine, Ser 6.21 (H) 0.76 - 1.27 mg/dL   eGFR 68 >30 QM/VHQ/4.69   BUN/Creatinine Ratio 11 9 - 20   Sodium 143 134 - 144 mmol/L   Potassium 4.3 3.5 - 5.2 mmol/L   Chloride 106 96 - 106 mmol/L   CO2 25 20 - 29 mmol/L   Calcium 9.4 8.7 - 10.2 mg/dL   Total Protein 6.5 6.0 - 8.5 g/dL   Albumin 4.4 4.1 - 5.1 g/dL   Globulin, Total 2.1 1.5 - 4.5 g/dL   Bilirubin  Total 0.4 0.0 - 1.2 mg/dL   Alkaline Phosphatase 40 (L) 44 - 121 IU/L   AST 22 0 - 40 IU/L   ALT 26 0 - 44 IU/L  Hemoglobin A1c  Result Value Ref Range   Hgb A1c MFr Bld 5.7 (H) 4.8 - 5.6 %   Est. average glucose Bld gHb Est-mCnc 117 mg/dL  Vitamin G29  Result Value Ref Range   Vitamin B-12 1,422 (H) 232 - 1,245 pg/mL  CBC with Diff  Result Value Ref Range   WBC 3.9 3.4 - 10.8 x10E3/uL   RBC 4.91 4.14 - 5.80 x10E6/uL   Hemoglobin 15.7 13.0 - 17.7 g/dL   Hematocrit 52.8 41.3 - 51.0 %   MCV 95 79 - 97 fL   MCH 32.0 26.6 - 33.0 pg   MCHC 33.8 31.5 - 35.7 g/dL   RDW 24.4 01.0 - 27.2 %   Platelets 198 150 - 450 x10E3/uL   Neutrophils 49 Not Estab. %   Lymphs 40 Not Estab. %   Monocytes 9 Not Estab. %   Eos 2 Not Estab. %   Basos 0 Not Estab. %   Neutrophils Absolute 1.9 1.4 - 7.0 x10E3/uL   Lymphocytes Absolute 1.5 0.7 - 3.1 x10E3/uL   Monocytes Absolute 0.4 0.1 - 0.9 x10E3/uL   EOS (ABSOLUTE) 0.1 0.0 - 0.4 x10E3/uL   Basophils Absolute 0.0 0.0 - 0.2 x10E3/uL   Immature Granulocytes 0 Not Estab. %   Immature Grans (Abs) 0.0 0.0 - 0.1 x10E3/uL    Recent Results (from the past 2160 hours)  Lipid panel     Status: Abnormal   Collection Time: 03/23/23  9:44 AM  Result Value Ref Range   Cholesterol, Total 181 100 - 199 mg/dL   Triglycerides 63 0 - 149 mg/dL   HDL 39 (L) >53 mg/dL   VLDL Cholesterol Cal 12 5 - 40 mg/dL   LDL Chol Calc (NIH) 664 (H) 0 - 99 mg/dL   Chol/HDL Ratio 4.6 0.0 - 5.0 ratio    Comment:  T. Chol/HDL Ratio                                             Men  Women                               1/2 Avg.Risk  3.4    3.3                                   Avg.Risk  5.0    4.4                                2X Avg.Risk  9.6    7.1                                3X Avg.Risk 23.4   11.0   VITAMIN D 25 Hydroxy (Vit-D Deficiency, Fractures)     Status: None   Collection Time: 03/23/23  9:44 AM  Result Value Ref Range   Vit D,  25-Hydroxy 38.5 30.0 - 100.0 ng/mL    Comment: Vitamin D deficiency has been defined by the Institute of Medicine and an Endocrine Society practice guideline as a level of serum 25-OH vitamin D less than 20 ng/mL (1,2). The Endocrine Society went on to further define vitamin D insufficiency as a level between 21 and 29 ng/mL (2). 1. IOM (Institute of Medicine). 2010. Dietary reference    intakes for calcium and D. [place]: The    [employer]. 2. Holick MF, Binkley Goodman, Bischoff-Ferrari HA, et al.    Evaluation, treatment, and prevention of vitamin D    deficiency: an Endocrine Society clinical practice    guideline. JCEM. 2011 Jul; 96(7):1911-30.   CMP14+EGFR     Status: Abnormal   Collection Time: 03/23/23  9:44 AM  Result Value Ref Range   Glucose 93 70 - 99 mg/dL   BUN 14 6 - 24 mg/dL   Creatinine, Ser 0.09 (H) 0.76 - 1.27 mg/dL   eGFR 68 >38 HW/EXH/3.71   BUN/Creatinine Ratio 11 9 - 20   Sodium 143 134 - 144 mmol/L   Potassium 4.3 3.5 - 5.2 mmol/L   Chloride 106 96 - 106 mmol/L   CO2 25 20 - 29 mmol/L   Calcium 9.4 8.7 - 10.2 mg/dL   Total Protein 6.5 6.0 - 8.5 g/dL   Albumin 4.4 4.1 - 5.1 g/dL   Globulin, Total 2.1 1.5 - 4.5 g/dL   Bilirubin Total 0.4 0.0 - 1.2 mg/dL   Alkaline Phosphatase 40 (L) 44 - 121 IU/L   AST 22 0 - 40 IU/L   ALT 26 0 - 44 IU/L  Hemoglobin A1c     Status: Abnormal   Collection Time: 03/23/23  9:44 AM  Result Value Ref Range   Hgb A1c MFr Bld 5.7 (H) 4.8 - 5.6 %    Comment:          Prediabetes: 5.7 - 6.4          Diabetes: >6.4          Glycemic control for adults with diabetes: <7.0  Est. average glucose Bld gHb Est-mCnc 117 mg/dL  Vitamin O13     Status: Abnormal   Collection Time: 03/23/23  9:44 AM  Result Value Ref Range   Vitamin B-12 1,422 (H) 232 - 1,245 pg/mL  CBC with Diff     Status: None   Collection Time: 03/23/23  9:44 AM  Result Value Ref Range   WBC 3.9 3.4 - 10.8 x10E3/uL   RBC 4.91 4.14 - 5.80  x10E6/uL   Hemoglobin 15.7 13.0 - 17.7 g/dL   Hematocrit 08.6 57.8 - 51.0 %   MCV 95 79 - 97 fL   MCH 32.0 26.6 - 33.0 pg   MCHC 33.8 31.5 - 35.7 g/dL   RDW 46.9 62.9 - 52.8 %   Platelets 198 150 - 450 x10E3/uL   Neutrophils 49 Not Estab. %   Lymphs 40 Not Estab. %   Monocytes 9 Not Estab. %   Eos 2 Not Estab. %   Basos 0 Not Estab. %   Neutrophils Absolute 1.9 1.4 - 7.0 x10E3/uL   Lymphocytes Absolute 1.5 0.7 - 3.1 x10E3/uL   Monocytes Absolute 0.4 0.1 - 0.9 x10E3/uL   EOS (ABSOLUTE) 0.1 0.0 - 0.4 x10E3/uL   Basophils Absolute 0.0 0.0 - 0.2 x10E3/uL   Immature Granulocytes 0 Not Estab. %   Immature Grans (Abs) 0.0 0.0 - 0.1 x10E3/uL       Assessment & Plan:   Problem List Items Addressed This Visit       Cardiovascular and Mediastinum   Essential hypertension, benign   Blood pressure well controlled with current medications.  Continue current therapy.  Will reassess at follow up.        Relevant Orders   CMP14+EGFR (Completed)   Vitamin B12 (Completed)   CBC with Diff (Completed)     Digestive   Gastroesophageal reflux disease without esophagitis   Patient stable.  Well controlled with current therapy.   Continue current meds.        Relevant Medications   omeprazole (PRILOSEC) 20 MG capsule     Other   Prediabetes   A1C Continues to be in prediabetic ranges.  Will reassess at follow up after next lab check.  Patient counseled on dietary choices and verbalized understanding.        Relevant Orders   CMP14+EGFR (Completed)   Hemoglobin A1c (Completed)   Vitamin B12 (Completed)   CBC with Diff (Completed)   Mixed hyperlipidemia - Primary   Checking labs today.  Continue current therapy for lipid control. Will modify as needed based on labwork results.        Relevant Orders   Lipid panel (Completed)   CMP14+EGFR (Completed)   Vitamin B12 (Completed)   CBC with Diff (Completed)   Vitamin D deficiency, unspecified   Checking labs today.   Will continue supplements as needed.        Relevant Orders   VITAMIN D 25 Hydroxy (Vit-D Deficiency, Fractures) (Completed)   CMP14+EGFR (Completed)   Vitamin B12 (Completed)   CBC with Diff (Completed)   Obesity, morbid (HCC)   Continue current meds.  Will adjust as needed based on results.  The patient is asked to make an attempt to improve diet and exercise patterns to aid in medical management of this problem. Addressed importance of increasing and maintaining water intake.        Other Visit Diagnoses       Flu vaccine need       Flu vaccine given  in office today.  Patient encouraged to manage symptoms as needed with supportive measures.   Relevant Orders   Influenza, MDCK, trivalent, PF(Flucelvax egg-free) (Completed)   CMP14+EGFR (Completed)   Vitamin B12 (Completed)   CBC with Diff (Completed)       Return in about 4 months (around 07/21/2023) for F/U.   Total time spent: 20 minutes  Miki Kins, FNP  03/23/2023   This document may have been prepared by [facility] Voice Recognition software and as such may include unintentional dictation errors.

## 2023-03-24 NOTE — Assessment & Plan Note (Signed)
Checking labs today.  Will continue supplements as needed.

## 2023-03-24 NOTE — Assessment & Plan Note (Signed)
Checking labs today.  Continue current therapy for lipid control. Will modify as needed based on labwork results.

## 2023-03-24 NOTE — Assessment & Plan Note (Signed)
Blood pressure well controlled with current medications.  Continue current therapy.  Will reassess at follow up.

## 2023-07-21 ENCOUNTER — Encounter: Payer: Self-pay | Admitting: Family

## 2023-07-21 ENCOUNTER — Ambulatory Visit: Payer: Medicaid Other | Admitting: Family

## 2023-07-21 ENCOUNTER — Ambulatory Visit
Admission: RE | Admit: 2023-07-21 | Discharge: 2023-07-21 | Disposition: A | Discharge status: Home / Self Care | Payer: Self-pay | Source: Ambulatory Visit | Attending: Family | Admitting: Family

## 2023-07-21 VITALS — BP 132/84 | HR 77 | Ht 72.0 in | Wt 260.8 lb

## 2023-07-21 DIAGNOSIS — I10 Essential (primary) hypertension: Secondary | ICD-10-CM | POA: Diagnosis not present

## 2023-07-21 DIAGNOSIS — M5441 Lumbago with sciatica, right side: Secondary | ICD-10-CM | POA: Diagnosis not present

## 2023-07-21 DIAGNOSIS — N5089 Other specified disorders of the male genital organs: Secondary | ICD-10-CM

## 2023-07-21 DIAGNOSIS — N50812 Left testicular pain: Secondary | ICD-10-CM | POA: Diagnosis not present

## 2023-07-21 DIAGNOSIS — K219 Gastro-esophageal reflux disease without esophagitis: Secondary | ICD-10-CM | POA: Diagnosis not present

## 2023-07-21 DIAGNOSIS — R7303 Prediabetes: Secondary | ICD-10-CM

## 2023-07-21 DIAGNOSIS — E559 Vitamin D deficiency, unspecified: Secondary | ICD-10-CM

## 2023-07-21 DIAGNOSIS — E782 Mixed hyperlipidemia: Secondary | ICD-10-CM

## 2023-07-21 MED ORDER — OMEPRAZOLE 20 MG PO CPDR: 20.0000 mg | DELAYED_RELEASE_CAPSULE | Freq: Every day | ORAL | 1 refills | Status: DC

## 2023-07-21 MED ORDER — METHOCARBAMOL 500 MG PO TABS: 500.0000 mg | ORAL_TABLET | Freq: Four times a day (QID) | ORAL | 0 refills | Status: DC

## 2023-07-21 NOTE — Assessment & Plan Note (Signed)
 A1C Continues to be in prediabetic ranges.  Will reassess at follow up after next lab check.  Patient counseled on dietary choices and verbalized understanding.

## 2023-07-21 NOTE — Assessment & Plan Note (Signed)
 Checking labs today.  Will continue supplements as needed.

## 2023-07-21 NOTE — Assessment & Plan Note (Signed)
 Blood pressure well controlled with current medications.  Continue current therapy.  Will reassess at follow up.

## 2023-07-21 NOTE — Assessment & Plan Note (Signed)
 Patient stable.  Well controlled with current therapy.   Continue current meds.

## 2023-07-21 NOTE — Assessment & Plan Note (Signed)
 Checking labs today.  Continue current therapy for lipid control. Will modify as needed based on labwork results.

## 2023-07-21 NOTE — Progress Notes (Signed)
 Established Patient Office Visit  Subjective:  Patient ID: Oscar Bennett, male    DOB: 12-Nov-1975  Age: 48 y.o. MRN: [id number]  Chief Complaint  Patient presents with   Follow-up    4 month follow up    Patient is here today for his 3 months follow up.  He has been feeling fairly well since last appointment.   He does have additional concerns to discuss today.   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  Has been difficult to stand up straight, was getting better, but he says that he jumped up suddenly and thinks he might have pulled it again.  Is radiating to his bilateral buttocks and right hamstring. Back pain is bilateral.   Also having pain in his left testicle occasionally, has a lump there as well. The pain is not constant, but he has been concerned since he has had the lump there.   Labs are due today. He needs refills.   I have reviewed his active problem list, medication list, allergies, notes from last encounter, lab results for his appointment today.    Back Pain This is a new problem. The current episode started more than 1 month ago. The problem occurs constantly. The problem has been gradually improving since onset. The pain is present in the gluteal and lumbar spine. The quality of the pain is described as stabbing. The pain radiates to the right thigh. The pain is severe. The pain is The same all the time. The symptoms are aggravated by bending and sitting. Stiffness is present All day. He has tried heat, ice, analgesics, NSAIDs, walking and bed rest for the symptoms. The treatment provided mild relief.    No other concerns at this time.   Past Medical History:  Diagnosis Date   Pain in left wrist 12/02/2022   Plantar fasciitis 09/14/2015    Past Surgical History:  Procedure Laterality Date   LEG SURGERY Right     Social History   Socioeconomic History   Marital  status: Single    Spouse name: Not on file   Number of children: Not on file   Years of education: Not on file   Highest education level: Not on file  Occupational History   Not on file  Tobacco Use   Smoking status: Never   Smokeless tobacco: Never  Substance and Sexual Activity   Alcohol use: No   Drug use: Yes   Sexual activity: Yes  Other Topics Concern   Not on file  Social History Narrative   Not on file   Social Drivers of Health   Financial Resource Strain: Not on file  Food Insecurity: Not on file  Transportation Needs: Not on file  Physical Activity: Not on file  Stress: Not on file  Social Connections: Not on file  Intimate Partner Violence: Not on file    History reviewed. No pertinent family history.  No Known Allergies  Review of Systems  Musculoskeletal:  Positive for back pain.  All other systems reviewed and are negative.      Objective:   BP 132/84   Pulse 77   Ht 6' (1.829 m)   Wt 260 lb 12.8 oz (118.3 kg)   SpO2 98%   BMI 35.37 kg/m   Vitals:   07/21/23 0903  BP: 132/84  Pulse: 77  Height: 6' (1.829 m)  Weight: 260 lb  12.8 oz (118.3 kg)  SpO2: 98%  BMI (Calculated): 35.36    Physical Exam Vitals and nursing note reviewed.  Constitutional:      Appearance: Normal appearance. He is normal weight.   Eyes:     Conjunctiva/sclera: Conjunctivae normal.     Pupils: Pupils are equal, round, and reactive to light.    Cardiovascular:     Rate and Rhythm: Normal rate and regular rhythm.     Pulses: Normal pulses.     Heart sounds: Normal heart sounds.  Pulmonary:     Effort: Pulmonary effort is normal.     Breath sounds: Normal breath sounds.   Neurological:     General: No focal deficit present.     Mental Status: He is alert and oriented to person, place, and time. Mental status is at baseline.   Psychiatric:        Mood and Affect: Mood normal.        Behavior: Behavior normal.        Thought Content: Thought content  normal.        Judgment: Judgment normal.      No results found for any visits on 07/21/23.  No results found for this or any previous visit (from the past 2160 hours).     Assessment & Plan:   Problem List Items Addressed This Visit       Cardiovascular and Mediastinum   Essential hypertension, benign   Blood pressure well controlled with current medications.  Continue current therapy.  Will reassess at follow up.        Relevant Orders   CMP14+EGFR   Vitamin B12   CBC with Diff     Digestive   Gastroesophageal reflux disease without esophagitis   Patient stable.  Well controlled with current therapy.   Continue current meds.        Relevant Medications   omeprazole  (PRILOSEC) 20 MG capsule     Other   Prediabetes   A1C Continues to be in prediabetic ranges.  Will reassess at follow up after next lab check.  Patient counseled on dietary choices and verbalized understanding.        Relevant Orders   CMP14+EGFR   Vitamin B12   CBC with Diff   Hemoglobin A1c   Mixed hyperlipidemia   Checking labs today.  Continue current therapy for lipid control. Will modify as needed based on labwork results.        Relevant Orders   CMP14+EGFR   Lipid panel   Vitamin B12   CBC with Diff   Vitamin D  deficiency, unspecified   Checking labs today.  Will continue supplements as needed.        Relevant Orders   CMP14+EGFR   VITAMIN D  25 Hydroxy (Vit-D Deficiency, Fractures)   Vitamin B12   CBC with Diff   Obesity, morbid (HCC)   Continue current meds.  Will adjust as needed based on results.  The patient is asked to make an attempt to improve diet and exercise patterns to aid in medical management of this problem. Addressed importance of increasing and maintaining water intake.        Other Visit Diagnoses       Acute bilateral low back pain with right-sided sciatica    -  Primary   Relevant Medications   methocarbamol (ROBAXIN) 500 MG tablet      Testicular pain, left       Relevant Orders   US  SCROTUM  Lump in the testicle       Relevant Orders   US  SCROTUM       Return in about 4 months (around 11/20/2023) for F/U.   Total time spent: 20 minutes  Trenda Frisk, FNP  07/21/2023   This document may have been prepared by [facility] Voice Recognition software and as such may include unintentional dictation errors.

## 2023-07-21 NOTE — Assessment & Plan Note (Signed)
 Continue current meds.  Will adjust as needed based on results.  The patient is asked to make an attempt to improve diet and exercise patterns to aid in medical management of this problem. Addressed importance of increasing and maintaining water intake.

## 2023-07-22 LAB — CBC WITH DIFFERENTIAL/PLATELET
Basophils Absolute: 0 10*3/uL (ref 0.0–0.2)
Basos: 1 %
EOS (ABSOLUTE): 0.1 10*3/uL (ref 0.0–0.4)
Eos: 3 %
Hematocrit: 47.4 % (ref 37.5–51.0)
Hemoglobin: 15.1 g/dL (ref 13.0–17.7)
Immature Grans (Abs): 0 10*3/uL (ref 0.0–0.1)
Immature Granulocytes: 0 %
Lymphocytes Absolute: 1.7 10*3/uL (ref 0.7–3.1)
Lymphs: 39 %
MCH: 30.5 pg (ref 26.6–33.0)
MCHC: 31.9 g/dL (ref 31.5–35.7)
MCV: 96 fL (ref 79–97)
Monocytes Absolute: 0.4 10*3/uL (ref 0.1–0.9)
Monocytes: 9 %
Neutrophils Absolute: 2.1 10*3/uL (ref 1.4–7.0)
Neutrophils: 48 %
Platelets: 183 10*3/uL (ref 150–450)
RBC: 4.95 x10E6/uL (ref 4.14–5.80)
RDW: 13.1 % (ref 11.6–15.4)
WBC: 4.3 10*3/uL (ref 3.4–10.8)

## 2023-07-22 LAB — CMP14+EGFR
ALT: 24 IU/L (ref 0–44)
AST: 17 IU/L (ref 0–40)
Albumin: 4.3 g/dL (ref 4.1–5.1)
Alkaline Phosphatase: 45 IU/L (ref 44–121)
BUN/Creatinine Ratio: 19 (ref 9–20)
BUN: 23 mg/dL (ref 6–24)
Bilirubin Total: 0.4 mg/dL (ref 0.0–1.2)
CO2: 22 mmol/L (ref 20–29)
Calcium: 9.3 mg/dL (ref 8.7–10.2)
Chloride: 108 mmol/L — ABNORMAL HIGH (ref 96–106)
Creatinine, Ser: 1.2 mg/dL (ref 0.76–1.27)
Globulin, Total: 2 g/dL (ref 1.5–4.5)
Glucose: 91 mg/dL (ref 70–99)
Potassium: 4.6 mmol/L (ref 3.5–5.2)
Sodium: 144 mmol/L (ref 134–144)
Total Protein: 6.3 g/dL (ref 6.0–8.5)
eGFR: 75 mL/min/{1.73_m2} (ref >59)

## 2023-07-22 LAB — LIPID PANEL
Chol/HDL Ratio: 4.1 ratio (ref 0.0–5.0)
Cholesterol, Total: 158 mg/dL (ref 100–199)
HDL: 39 mg/dL — ABNORMAL LOW (ref >39)
LDL Chol Calc (NIH): 106 mg/dL — ABNORMAL HIGH (ref 0–99)
Triglycerides: 67 mg/dL (ref 0–149)
VLDL Cholesterol Cal: 13 mg/dL (ref 5–40)

## 2023-07-22 LAB — VITAMIN B12: Vitamin B-12: 704 pg/mL (ref 232–1245)

## 2023-07-22 LAB — HEMOGLOBIN A1C
Est. average glucose Bld gHb Est-mCnc: 120 mg/dL
Hgb A1c MFr Bld: 5.8 % — ABNORMAL HIGH (ref 4.8–5.6)

## 2023-07-22 LAB — VITAMIN D 25 HYDROXY (VIT D DEFICIENCY, FRACTURES): Vit D, 25-Hydroxy: 28.3 ng/mL — ABNORMAL LOW (ref 30.0–100.0)

## 2023-07-28 ENCOUNTER — Ambulatory Visit: Payer: Self-pay

## 2023-08-11 ENCOUNTER — Other Ambulatory Visit: Payer: Self-pay

## 2023-08-17 ENCOUNTER — Other Ambulatory Visit: Payer: Self-pay | Admitting: Family

## 2023-08-17 DIAGNOSIS — M5441 Lumbago with sciatica, right side: Secondary | ICD-10-CM

## 2023-09-07 ENCOUNTER — Ambulatory Visit: Admitting: Family

## 2023-09-15 ENCOUNTER — Ambulatory Visit
Admission: RE | Admit: 2023-09-15 | Discharge: 2023-09-15 | Disposition: A | Discharge status: Home / Self Care | Attending: Family | Admitting: Family

## 2023-09-15 ENCOUNTER — Ambulatory Visit
Admission: RE | Admit: 2023-09-15 | Discharge: 2023-09-15 | Disposition: A | Discharge status: Home / Self Care | Source: Ambulatory Visit | Attending: Family | Admitting: Family

## 2023-09-15 ENCOUNTER — Ambulatory Visit: Admitting: Family

## 2023-09-15 ENCOUNTER — Encounter: Payer: Self-pay | Admitting: Family

## 2023-09-15 VITALS — BP 140/82 | HR 81 | Ht 72.0 in | Wt 256.4 lb

## 2023-09-15 DIAGNOSIS — M545 Low back pain, unspecified: Secondary | ICD-10-CM

## 2023-09-15 DIAGNOSIS — F5101 Primary insomnia: Secondary | ICD-10-CM | POA: Diagnosis not present

## 2023-09-15 DIAGNOSIS — I861 Scrotal varices: Secondary | ICD-10-CM | POA: Diagnosis not present

## 2023-09-15 DIAGNOSIS — H539 Unspecified visual disturbance: Secondary | ICD-10-CM

## 2023-09-15 MED ORDER — TRAZODONE HCL 50 MG PO TABS: 50.0000 mg | ORAL_TABLET | Freq: Every day | ORAL | 1 refills | Status: DC

## 2023-09-15 NOTE — Progress Notes (Signed)
 Established Patient Office Visit  Subjective:  Patient ID: Oscar Bennett, male    DOB: [birth year]  Age: 48 y.o. MRN: [id number]  Chief Complaint  Patient presents with   Back Pain    Wants to discuss referral    Patient is here today with a few different concerns:   Back Pain This is a chronic problem. The problem has been waxing and waning since onset. The pain is present in the lumbar spine and gluteal. The quality of the pain is described as aching. The pain radiates to the left thigh and right thigh (lately has been more on the left). The symptoms are aggravated by position, sitting, standing, twisting and bending. He has tried analgesics, heat, home exercises, muscle relaxant, walking and bed rest for the symptoms. The treatment provided no relief.  Insomnia Primary symptoms: difficulty falling asleep, frequent awakening.   The current episode started more than one month. The onset quality is gradual. The problem occurs nightly. The problem is unchanged. The symptoms are aggravated by anxiety. How many beverages per day that contain caffeine: 0 - 1.  Nothing relieves the symptoms. Treatments tried: melatonin, darkened room, sleep hygiene. The treatment provided no relief. How long after going to bed to you fall asleep: over an hour.   Prior diagnostic workup includes:  Blood work.  Eye Problem  Both eyes are affected. This is a new problem. The current episode started more than 1 month ago. The problem occurs constantly. There was no injury mechanism. The pain is at a severity of 0/10. The patient is experiencing no pain. There is No known exposure to pink eye. He Does not wear contacts. Associated symptoms comments: Vision changes, squinting to read frequently.SABRA He has tried nothing for the symptoms. The treatment provided no relief.   Also needs a referral to Urology for the Varicocele and cysts that were found on his ultrasound.   No other concerns at this time.   Past  Medical History:  Diagnosis Date   Pain in left wrist 12/02/2022   Plantar fasciitis 09/14/2015    Past Surgical History:  Procedure Laterality Date   LEG SURGERY Right     Social History   Socioeconomic History   Marital status: Single    Spouse name: Not on file   Number of children: Not on file   Years of education: Not on file   Highest education level: Not on file  Occupational History   Not on file  Tobacco Use   Smoking status: Never   Smokeless tobacco: Never  Substance and Sexual Activity   Alcohol use: No   Drug use: Yes   Sexual activity: Yes  Other Topics Concern   Not on file  Social History Narrative   Not on file   Social Drivers of Health   Financial Resource Strain: Not on file  Food Insecurity: Not on file  Transportation Needs: Not on file  Physical Activity: Not on file  Stress: Not on file  Social Connections: Not on file  Intimate Partner Violence: Not on file    No family history on file.  No Known Allergies  Review of Systems  Eyes:        Vision changes, squinting  Musculoskeletal:  Positive for back pain.  Psychiatric/Behavioral:  The patient has insomnia.   All other systems reviewed and are negative.      Objective:   BP (!) 140/82   Pulse 81   Ht 6' (1.829 m)  Wt 256 lb 6.4 oz (116.3 kg)   SpO2 97%   BMI 34.77 kg/m   Vitals:   09/15/23 1127  BP: (!) 140/82  Pulse: 81  Height: 6' (1.829 m)  Weight: 256 lb 6.4 oz (116.3 kg)  SpO2: 97%  BMI (Calculated): 34.77    Physical Exam Vitals and nursing note reviewed.  Constitutional:      Appearance: Normal appearance. He is normal weight.  Eyes:     Extraocular Movements: Extraocular movements intact.     Conjunctiva/sclera: Conjunctivae normal.     Pupils: Pupils are equal, round, and reactive to light.  Cardiovascular:     Rate and Rhythm: Normal rate and regular rhythm.     Pulses: Normal pulses.     Heart sounds: Normal heart sounds.  Pulmonary:      Effort: Pulmonary effort is normal.     Breath sounds: Normal breath sounds.  Musculoskeletal:        General: Normal range of motion.     Cervical back: Normal range of motion.  Neurological:     General: No focal deficit present.     Mental Status: He is alert and oriented to person, place, and time.  Psychiatric:        Mood and Affect: Mood normal.        Behavior: Behavior normal.        Thought Content: Thought content normal.        Judgment: Judgment normal.      No results found for any visits on 09/15/23.  Recent Results (from the past 2160 hours)  CMP14+EGFR     Status: Abnormal   Collection Time: 07/21/23  9:43 AM  Result Value Ref Range   Glucose 91 70 - 99 mg/dL   BUN 23 6 - 24 mg/dL   Creatinine, Ser 8.79 0.76 - 1.27 mg/dL   eGFR 75 >40 fO/fpw/8.26   BUN/Creatinine Ratio 19 9 - 20   Sodium 144 134 - 144 mmol/L   Potassium 4.6 3.5 - 5.2 mmol/L   Chloride 108 (H) 96 - 106 mmol/L   CO2 22 20 - 29 mmol/L   Calcium 9.3 8.7 - 10.2 mg/dL   Total Protein 6.3 6.0 - 8.5 g/dL   Albumin 4.3 4.1 - 5.1 g/dL   Globulin, Total 2.0 1.5 - 4.5 g/dL   Bilirubin Total 0.4 0.0 - 1.2 mg/dL   Alkaline Phosphatase 45 44 - 121 IU/L   AST 17 0 - 40 IU/L   ALT 24 0 - 44 IU/L  Lipid panel     Status: Abnormal   Collection Time: 07/21/23  9:43 AM  Result Value Ref Range   Cholesterol, Total 158 100 - 199 mg/dL   Triglycerides 67 0 - 149 mg/dL   HDL 39 (L) >60 mg/dL   VLDL Cholesterol Cal 13 5 - 40 mg/dL   LDL Chol Calc (NIH) 893 (H) 0 - 99 mg/dL   Chol/HDL Ratio 4.1 0.0 - 5.0 ratio    Comment:                                   T. Chol/HDL Ratio                                             Men  Women  1/2 Avg.Risk  3.4    3.3                                   Avg.Risk  5.0    4.4                                2X Avg.Risk  9.6    7.1                                3X Avg.Risk 23.4   11.0   VITAMIN D  25 Hydroxy (Vit-D Deficiency, Fractures)      Status: Abnormal   Collection Time: 07/21/23  9:43 AM  Result Value Ref Range   Vit D, 25-Hydroxy 28.3 (L) 30.0 - 100.0 ng/mL    Comment: Vitamin D  deficiency has been defined by the Institute of Medicine and an Endocrine Society practice guideline as a level of serum 25-OH vitamin D  less than 20 ng/mL (1,2). The Endocrine Society went on to further define vitamin D  insufficiency as a level between 21 and 29 ng/mL (2). 1. IOM (Institute of Medicine). 2010. Dietary reference    intakes for calcium and D. [place]: The    [employer]. 2. Holick MF, Binkley [place], Bischoff-Ferrari HA, et al.    Evaluation, treatment, and prevention of vitamin D     deficiency: an Endocrine Society clinical practice    guideline. JCEM. 2011 Jul; 96(7):1911-30.   Vitamin B12     Status: None   Collection Time: 07/21/23  9:43 AM  Result Value Ref Range   Vitamin B-12 704 232 - 1,245 pg/mL  CBC with Diff     Status: None   Collection Time: 07/21/23  9:43 AM  Result Value Ref Range   WBC 4.3 3.4 - 10.8 x10E3/uL   RBC 4.95 4.14 - 5.80 x10E6/uL   Hemoglobin 15.1 13.0 - 17.7 g/dL   Hematocrit 52.5 62.4 - 51.0 %   MCV 96 79 - 97 fL   MCH 30.5 26.6 - 33.0 pg   MCHC 31.9 31.5 - 35.7 g/dL   RDW 86.8 88.3 - 84.5 %   Platelets 183 150 - 450 x10E3/uL   Neutrophils 48 Not Estab. %   Lymphs 39 Not Estab. %   Monocytes 9 Not Estab. %   Eos 3 Not Estab. %   Basos 1 Not Estab. %   Neutrophils Absolute 2.1 1.4 - 7.0 x10E3/uL   Lymphocytes Absolute 1.7 0.7 - 3.1 x10E3/uL   Monocytes Absolute 0.4 0.1 - 0.9 x10E3/uL   EOS (ABSOLUTE) 0.1 0.0 - 0.4 x10E3/uL   Basophils Absolute 0.0 0.0 - 0.2 x10E3/uL   Immature Granulocytes 0 Not Estab. %   Immature Grans (Abs) 0.0 0.0 - 0.1 x10E3/uL  Hemoglobin A1c     Status: Abnormal   Collection Time: 07/21/23  9:43 AM  Result Value Ref Range   Hgb A1c MFr Bld 5.8 (H) 4.8 - 5.6 %    Comment:          Prediabetes: 5.7 - 6.4          Diabetes: >6.4           Glycemic control for adults with diabetes: <7.0    Est. average glucose Bld gHb Est-mCnc 120 mg/dL  Assessment & Plan Lumbar pain Sending for XR today.  Patient going to [facility] for this scan.  Will call with results when available.   Vision changes Setting patient up for referral to Ophthalmology.  Will defer to them for further treatment changes.  Reassess at follow up.  Varicocele Setting patient up for referral to Urology.  Will defer to them for further treatment changes.  Reassess at follow up.  Primary insomnia Sending medications for pt.  Will reassess at follow up for effectiveness.     Return in about 1 month (around 10/16/2023).   Total time spent: 30 minutes  ALAN CHRISTELLA ARRANT, FNP  09/15/2023   This document may have been prepared by [facility] Voice Recognition software and as such may include unintentional dictation errors.

## 2023-09-17 ENCOUNTER — Encounter: Payer: Self-pay | Admitting: Family

## 2023-09-28 ENCOUNTER — Ambulatory Visit: Payer: Self-pay | Admitting: Cardiology

## 2023-10-08 ENCOUNTER — Other Ambulatory Visit: Payer: Self-pay | Admitting: Family

## 2023-10-19 ENCOUNTER — Ambulatory Visit: Admitting: Family

## 2023-11-26 ENCOUNTER — Encounter: Payer: Self-pay | Admitting: Family

## 2023-11-28 ENCOUNTER — Ambulatory Visit: Admitting: Family

## 2023-12-12 ENCOUNTER — Ambulatory Visit: Admitting: Family

## 2023-12-21 ENCOUNTER — Encounter: Payer: Self-pay | Admitting: Family

## 2023-12-21 ENCOUNTER — Ambulatory Visit: Admitting: Family

## 2023-12-21 VITALS — BP 132/84 | HR 92 | Ht 72.0 in | Wt 261.2 lb

## 2023-12-21 DIAGNOSIS — Z23 Encounter for immunization: Secondary | ICD-10-CM | POA: Insufficient documentation

## 2023-12-21 DIAGNOSIS — E782 Mixed hyperlipidemia: Secondary | ICD-10-CM

## 2023-12-21 DIAGNOSIS — R7303 Prediabetes: Secondary | ICD-10-CM

## 2023-12-21 DIAGNOSIS — E559 Vitamin D deficiency, unspecified: Secondary | ICD-10-CM

## 2023-12-21 DIAGNOSIS — M170 Bilateral primary osteoarthritis of knee: Secondary | ICD-10-CM | POA: Insufficient documentation

## 2023-12-21 DIAGNOSIS — D489 Neoplasm of uncertain behavior, unspecified: Secondary | ICD-10-CM | POA: Insufficient documentation

## 2023-12-21 DIAGNOSIS — Z1211 Encounter for screening for malignant neoplasm of colon: Secondary | ICD-10-CM | POA: Insufficient documentation

## 2023-12-21 DIAGNOSIS — I10 Essential (primary) hypertension: Secondary | ICD-10-CM

## 2023-12-21 DIAGNOSIS — K219 Gastro-esophageal reflux disease without esophagitis: Secondary | ICD-10-CM

## 2023-12-21 DIAGNOSIS — Z6835 Body mass index (BMI) 35.0-35.9, adult: Secondary | ICD-10-CM

## 2023-12-21 MED ORDER — OMEPRAZOLE 20 MG PO CPDR: 20.0000 mg | DELAYED_RELEASE_CAPSULE | Freq: Every day | ORAL | 1 refills | Status: DC

## 2023-12-21 NOTE — Assessment & Plan Note (Signed)
 flu shot given

## 2023-12-21 NOTE — Progress Notes (Unsigned)
 Established Patient Office Visit  Subjective:  Patient ID: Oscar Bennett, male    DOB: 02-May-1975  Age: 48 y.o. MRN: [id number]  Chief Complaint  Patient presents with   Follow-up    4 month follow up    Patient is here today for his 4 months follow up.  He has been feeling fairly well since last appointment.   He does have additional concerns to discuss today. Reports bilateral knee pain.He reports not being able to walk up the stairs as It is impairing his gait. He has hx of playing sports and lifting heavy weights. He denies any previous injury that he is aware of. Will check arthritis labs today and patient would prefer referral to orthopedics. Referral sent.  Patient has complaints of bump on his forehead for 1 year. He reports it has gotten darker and getting bigger but that it does go away if he pops it. Denies itching or pain or unusual colors. Reports when it drains blood comes out. Will send referral to dermatology to determine etiology and treatment plan.  Referral for eye exam was sent 09/2023 to [facility]. Recommend patient to contact them to get his eye exam completed. Labs are due today but patient is not fasting so he will return in the next week for fasting blood work. He needs refills.   Patient is past due for colon cancer screening. Will order GI referral today.  I have reviewed his active problem list, medication list, allergies, family history, social history, health maintenance, notes from last encounter, lab results for his appointment today.      No other concerns at this time.   Past Medical History:  Diagnosis Date   Pain in left wrist 12/02/2022   Plantar fasciitis 09/14/2015    Past Surgical History:  Procedure Laterality Date   LEG SURGERY Right     Social History   Socioeconomic History   Marital status: Single    Spouse name: Not on file   Number of children: Not on file   Years of education: Not on file   Highest  education level: Not on file  Occupational History   Not on file  Tobacco Use   Smoking status: Never   Smokeless tobacco: Never  Substance and Sexual Activity   Alcohol use: No   Drug use: Yes   Sexual activity: Yes  Other Topics Concern   Not on file  Social History Narrative   Not on file   Social Drivers of Health   Financial Resource Strain: Not on file  Food Insecurity: Not on file  Transportation Needs: Not on file  Physical Activity: Not on file  Stress: Not on file  Social Connections: Not on file  Intimate Partner Violence: Not on file    History reviewed. No pertinent family history.  No Known Allergies  Review of Systems  Constitutional:  Negative for malaise/fatigue.  HENT: Negative.    Eyes:  Negative for blurred vision and pain.  Respiratory:  Negative for cough and shortness of breath.   Cardiovascular:  Negative for chest pain, palpitations, claudication and leg swelling.  Gastrointestinal:  Negative for abdominal pain, blood in stool, constipation, diarrhea, nausea and vomiting.  Genitourinary:  Negative for dysuria, frequency and urgency.  Musculoskeletal:  Positive for joint pain (bilateral knee pain).  Skin:  Positive for rash (center forehead above eyebrows).  Neurological:  Negative for dizziness, tingling, sensory change and headaches.  Endo/Heme/Allergies: Negative.   Psychiatric/Behavioral: Negative.  Objective:   BP 132/84   Pulse 92   Ht 6' (1.829 m)   Wt 261 lb 3.2 oz (118.5 kg)   SpO2 97%   BMI 35.43 kg/m   Vitals:   12/21/23 1139  BP: 132/84  Pulse: 92  Height: 6' (1.829 m)  Weight: 261 lb 3.2 oz (118.5 kg)  SpO2: 97%  BMI (Calculated): 35.42    Physical Exam Vitals and nursing note reviewed.  Constitutional:      Appearance: Normal appearance.  HENT:     Head: Normocephalic.  Eyes:     Extraocular Movements: Extraocular movements intact.     Pupils: Pupils are equal, round, and reactive to light.   Cardiovascular:     Rate and Rhythm: Normal rate and regular rhythm.     Pulses: Normal pulses.     Heart sounds: Normal heart sounds. No murmur heard. Pulmonary:     Effort: Pulmonary effort is normal. No respiratory distress.     Breath sounds: Normal breath sounds.  Abdominal:     General: There is no distension.     Tenderness: There is no abdominal tenderness.  Musculoskeletal:        General: No tenderness. Normal range of motion.     Cervical back: Normal range of motion and neck supple.     Right lower leg: No edema.     Left lower leg: No edema.  Skin:    General: Skin is warm and dry.     Coloration: Skin is not jaundiced.     Findings: No erythema.  Neurological:     General: No focal deficit present.     Mental Status: He is alert and oriented to person, place, and time.  Psychiatric:        Mood and Affect: Mood normal.        Speech: Speech normal.        Behavior: Behavior is cooperative.        Cognition and Memory: Memory is not impaired.      No results found for any visits on 12/21/23.  No results found for this or any previous visit (from the past 2160 hours).     Assessment & Plan:   Assessment & Plan Vitamin D  deficiency, unspecified  Essential hypertension, benign Severe obesity with body mass index (BMI) of 35.0 to 35.9 and comorbidity (HCC) Mixed hyperlipidemia Prediabetes - Continue healthy diet and exercise as tolerated. - Continue medications as prescribed. - Check labs when fasting Gastroesophageal reflux disease without esophagitis - continue taking medication as prescribed. Refill sent. - Avoid dietary triggers Arthritis of both knees - Orthopedic referral sent. - Defer knee pain to them at this time. - Continue OTC pain relief as needed and discussed conservative therapies with patient. - Check arthritis panel. Neoplasm, uncertain whether benign or malignant - Dermatology referral sent. - Defer dx and treatment options to  them at this time. - Discussed ABCDE scale with patient to monitor any changes to neoplasm and report asap. Needs flu shot - flu shot given. Colon cancer screening - GI referral sent for colon cancer screening.    Return in about 3 months (around 03/22/2024).   Total time spent: 25 minutes  Oscar DELENA Cain, FNP  12/21/2023   This document may have been prepared by [facility] Voice Recognition software and as such may include unintentional dictation errors.

## 2023-12-21 NOTE — Assessment & Plan Note (Signed)
GI referral sent for colon cancer screening

## 2023-12-21 NOTE — Assessment & Plan Note (Signed)
-   Continue healthy diet and exercise as tolerated. - Continue medications as prescribed. - Check labs when fasting

## 2023-12-21 NOTE — Assessment & Plan Note (Signed)
-   continue taking medication as prescribed. Refill sent. - Avoid dietary triggers

## 2023-12-21 NOTE — Assessment & Plan Note (Signed)
-   Orthopedic referral sent. - Defer knee pain to them at this time. - Continue OTC pain relief as needed and discussed conservative therapies with patient. - Check arthritis panel.

## 2023-12-21 NOTE — Assessment & Plan Note (Signed)
-   Dermatology referral sent. - Defer dx and treatment options to them at this time. - Discussed ABCDE scale with patient to monitor any changes to neoplasm and report asap.

## 2023-12-23 NOTE — Assessment & Plan Note (Signed)
 Checking labs today.  Will continue supplements as needed.   - Vitamin D 

## 2023-12-27 ENCOUNTER — Other Ambulatory Visit: Payer: Self-pay

## 2023-12-27 DIAGNOSIS — K219 Gastro-esophageal reflux disease without esophagitis: Secondary | ICD-10-CM

## 2023-12-27 MED ORDER — OMEPRAZOLE 20 MG PO CPDR
20.0000 mg | DELAYED_RELEASE_CAPSULE | Freq: Every day | ORAL | 1 refills | Status: AC
Start: 2023-12-27 — End: ?
